# Patient Record
Sex: Female | Born: 1960 | Race: White | Hispanic: No | Marital: Married | Smoking: Never smoker
Health system: Southern US, Community
[De-identification: ages and names within clinical notes are randomized; demographics above are authoritative.]

## PROBLEM LIST (undated history)

## (undated) DIAGNOSIS — N390 Urinary tract infection, site not specified: Secondary | ICD-10-CM

## (undated) DIAGNOSIS — R519 Headache, unspecified: Secondary | ICD-10-CM

## (undated) DIAGNOSIS — Z9289 Personal history of other medical treatment: Secondary | ICD-10-CM

## (undated) DIAGNOSIS — J310 Chronic rhinitis: Secondary | ICD-10-CM

## (undated) DIAGNOSIS — F32A Depression, unspecified: Secondary | ICD-10-CM

## (undated) DIAGNOSIS — F329 Major depressive disorder, single episode, unspecified: Secondary | ICD-10-CM

## (undated) DIAGNOSIS — Z780 Asymptomatic menopausal state: Secondary | ICD-10-CM

## (undated) DIAGNOSIS — R51 Headache: Secondary | ICD-10-CM

## (undated) HISTORY — PX: BREAST SURGERY: SHX581

## (undated) HISTORY — PX: HERNIA REPAIR: SHX51

## (undated) HISTORY — DX: Urinary tract infection, site not specified: N39.0

## (undated) HISTORY — PX: TONSILLECTOMY: SUR1361

## (undated) HISTORY — DX: Personal history of other medical treatment: Z92.89

## (undated) HISTORY — PX: OTHER SURGICAL HISTORY: SHX169

---

## 2004-09-26 HISTORY — PX: AUGMENTATION MAMMAPLASTY: SUR837

## 2005-07-13 ENCOUNTER — Ambulatory Visit: Payer: Self-pay | Admitting: General Surgery

## 2005-09-01 ENCOUNTER — Ambulatory Visit: Payer: Self-pay

## 2006-09-04 ENCOUNTER — Ambulatory Visit: Payer: Self-pay

## 2008-02-12 ENCOUNTER — Ambulatory Visit: Payer: Self-pay | Admitting: Obstetrics and Gynecology

## 2008-11-12 ENCOUNTER — Ambulatory Visit: Payer: Self-pay | Admitting: Obstetrics and Gynecology

## 2008-11-17 ENCOUNTER — Ambulatory Visit: Payer: Self-pay | Admitting: Obstetrics and Gynecology

## 2009-06-17 ENCOUNTER — Ambulatory Visit: Payer: Self-pay | Admitting: Obstetrics and Gynecology

## 2009-09-26 HISTORY — PX: ABDOMINAL HYSTERECTOMY: SHX81

## 2010-01-27 ENCOUNTER — Ambulatory Visit: Payer: Self-pay | Admitting: Obstetrics and Gynecology

## 2010-07-05 ENCOUNTER — Ambulatory Visit: Payer: Self-pay | Admitting: Obstetrics and Gynecology

## 2010-07-19 ENCOUNTER — Ambulatory Visit: Payer: Self-pay | Admitting: Obstetrics and Gynecology

## 2011-08-08 ENCOUNTER — Ambulatory Visit: Payer: Self-pay | Admitting: Obstetrics and Gynecology

## 2012-08-08 ENCOUNTER — Ambulatory Visit: Payer: Self-pay | Admitting: Obstetrics and Gynecology

## 2013-08-12 ENCOUNTER — Ambulatory Visit: Payer: Self-pay | Admitting: Family Medicine

## 2013-08-16 ENCOUNTER — Ambulatory Visit: Payer: Self-pay | Admitting: Family Medicine

## 2014-10-03 ENCOUNTER — Ambulatory Visit: Payer: Self-pay | Admitting: Family Medicine

## 2015-06-25 ENCOUNTER — Other Ambulatory Visit: Payer: Self-pay | Admitting: Unknown Physician Specialty

## 2015-06-25 DIAGNOSIS — R51 Headache: Principal | ICD-10-CM

## 2015-06-25 DIAGNOSIS — R430 Anosmia: Secondary | ICD-10-CM

## 2015-06-25 DIAGNOSIS — R519 Headache, unspecified: Secondary | ICD-10-CM

## 2015-07-03 ENCOUNTER — Encounter: Payer: Self-pay | Admitting: *Deleted

## 2015-07-06 ENCOUNTER — Ambulatory Visit: Payer: BLUE CROSS/BLUE SHIELD | Admitting: Anesthesiology

## 2015-07-06 ENCOUNTER — Ambulatory Visit
Admission: RE | Admit: 2015-07-06 | Discharge: 2015-07-06 | Disposition: A | Discharge status: Home / Self Care | Payer: BLUE CROSS/BLUE SHIELD | Source: Ambulatory Visit | Attending: Gastroenterology | Admitting: Gastroenterology

## 2015-07-06 ENCOUNTER — Encounter
Admission: RE | Disposition: A | Discharge status: Home / Self Care | Payer: Self-pay | Source: Ambulatory Visit | Attending: Gastroenterology

## 2015-07-06 ENCOUNTER — Encounter: Payer: Self-pay | Admitting: *Deleted

## 2015-07-06 DIAGNOSIS — Z90710 Acquired absence of both cervix and uterus: Secondary | ICD-10-CM | POA: Insufficient documentation

## 2015-07-06 DIAGNOSIS — Z1211 Encounter for screening for malignant neoplasm of colon: Secondary | ICD-10-CM | POA: Insufficient documentation

## 2015-07-06 DIAGNOSIS — K5730 Diverticulosis of large intestine without perforation or abscess without bleeding: Secondary | ICD-10-CM | POA: Insufficient documentation

## 2015-07-06 DIAGNOSIS — F329 Major depressive disorder, single episode, unspecified: Secondary | ICD-10-CM | POA: Insufficient documentation

## 2015-07-06 DIAGNOSIS — K621 Rectal polyp: Secondary | ICD-10-CM | POA: Diagnosis not present

## 2015-07-06 HISTORY — DX: Headache, unspecified: R51.9

## 2015-07-06 HISTORY — PX: COLONOSCOPY WITH PROPOFOL: SHX5780

## 2015-07-06 HISTORY — DX: Chronic rhinitis: J31.0

## 2015-07-06 HISTORY — DX: Headache: R51

## 2015-07-06 HISTORY — DX: Major depressive disorder, single episode, unspecified: F32.9

## 2015-07-06 HISTORY — DX: Depression, unspecified: F32.A

## 2015-07-06 HISTORY — DX: Asymptomatic menopausal state: Z78.0

## 2015-07-06 SURGERY — COLONOSCOPY WITH PROPOFOL
Anesthesia: General

## 2015-07-06 MED ORDER — PROPOFOL 500 MG/50ML IV EMUL
INTRAVENOUS | Status: DC | PRN
  Administered 2015-07-06: 120 ug/kg/min via INTRAVENOUS

## 2015-07-06 MED ORDER — FENTANYL CITRATE (PF) 100 MCG/2ML IJ SOLN
INTRAMUSCULAR | Status: DC | PRN
  Administered 2015-07-06: 50 ug via INTRAVENOUS

## 2015-07-06 MED ORDER — SODIUM CHLORIDE 0.9 % IV SOLN
INTRAVENOUS | Status: DC
Start: 2015-07-06 — End: 2015-07-06
  Administered 2015-07-06: 1000 mL via INTRAVENOUS
  Administered 2015-07-06: 08:00:00 via INTRAVENOUS

## 2015-07-06 MED ORDER — PROPOFOL 10 MG/ML IV BOLUS
INTRAVENOUS | Status: DC | PRN
  Administered 2015-07-06: 10 mg via INTRAVENOUS
  Administered 2015-07-06: 20 mg via INTRAVENOUS

## 2015-07-06 MED ORDER — MIDAZOLAM HCL 2 MG/2ML IJ SOLN
INTRAMUSCULAR | Status: DC | PRN
  Administered 2015-07-06: 1 mg via INTRAVENOUS

## 2015-07-06 NOTE — Transfer of Care (Signed)
Immediate Anesthesia Transfer of Care Note  Patient: Christine Cardenas  Procedure(s) Performed: Procedure(s): COLONOSCOPY WITH PROPOFOL (N/A)  Patient Location: PACU  Anesthesia Type:General  Level of Consciousness: awake and alert   Airway & Oxygen Therapy: Patient Spontanous Breathing and Patient connected to nasal cannula oxygen  Post-op Assessment: Report given to RN and Post -op Vital signs reviewed and stable  Post vital signs: Reviewed and stable  Last Vitals:  Filed Vitals:   07/06/15 0722  BP: 139/97  Temp: 36.3 C  Resp: 16    Complications: No apparent anesthesia complications

## 2015-07-06 NOTE — Op Note (Signed)
[facility] Patient Name: Christine Cardenas Procedure Date: 07/06/2015 8:03 AM MRN: [id number] Account #: [id number] Date of Birth: 1961-06-11 Admit Type: Outpatient Age: 54 Room: [facility] ENDO ROOM 3 Gender: Female Note Status: Finalized Procedure:         Colonoscopy Indications:       Colon cancer screening in patient at increased risk:                     Family history of colon polyps, This is the patient's                     first colonoscopy Patient Profile:   This is a 54 year old female. Providers:         Rhona Raider. Shelle Iron, MD Referring MD:      Marisue Ivan (Referring MD) Medicines:         Propofol per Anesthesia Complications:     No immediate complications. Procedure:         Pre-Anesthesia Assessment:                    - Prior to the procedure, a History and Physical was                     performed, and patient medications, allergies and                     sensitivities were reviewed. The patient's tolerance of                     previous anesthesia was reviewed.                    After obtaining informed consent, the colonoscope was                     passed under direct vision. Throughout the procedure, the                     patient's blood pressure, pulse, and oxygen saturations                     were monitored continuously. The Colonoscope was                     introduced through the anus and advanced to the the cecum,                     identified by appendiceal orifice and ileocecal valve. The                     colonoscopy was somewhat difficult due to a tortuous                     colon. The patient tolerated the procedure well. The                     quality of the bowel preparation was excellent. Findings:      The perianal and digital rectal examinations were normal.      A 2 mm polyp was found in the rectum. The polyp was sessile. The polyp       was removed with a jumbo cold forceps. Resection and  retrieval were       complete.      A few  small and large-mouthed diverticula were found in the sigmoid       colon.      The exam was otherwise without abnormality. Impression:        - One 2 mm polyp in the rectum. Resected and retrieved.                    - Diverticulosis in the sigmoid colon.                    - The examination was otherwise normal. Recommendation:    - Observe patient in GI recovery unit.                    - High fiber diet.                    - Continue present medications.                    - Await pathology results.                    - Repeat colonoscopy for surveillance vs screening                     depending on path in 5 years.                    - Return to referring physician.                    - The findings and recommendations were discussed with the                     patient.                    - The findings and recommendations were discussed with the                     patient's family. Procedure Code(s): --- Professional ---                    [phone], Colonoscopy, flexible; with biopsy, single or                     multiple CPT copyright 2014 American Medical Association. All rights reserved. The codes documented in this report are preliminary and upon coder review may  be revised to meet current compliance requirements. Kathalene Frames, MD 07/06/2015 8:41:50 AM This report has been signed electronically. Number of Addenda: 0 Note Initiated On: 07/06/2015 8:03 AM Scope Withdrawal Time: 0 hours 13 minutes 13 seconds  Total Procedure Duration: 0 hours 20 minutes 8 seconds       [facility]

## 2015-07-06 NOTE — Anesthesia Postprocedure Evaluation (Signed)
  Anesthesia Post-op Note  Patient: Christine Cardenas  Procedure(s) Performed: Procedure(s): COLONOSCOPY WITH PROPOFOL (N/A)  Anesthesia type:General  Patient location: PACU  Post pain: Pain level controlled  Post assessment: Post-op Vital signs reviewed, Patient's Cardiovascular Status Stable, Respiratory Function Stable, Patent Airway and No signs of Nausea or vomiting  Post vital signs: Reviewed and stable  Last Vitals:  Filed Vitals:   07/06/15 0920  BP: 134/85  Pulse: 52  Temp:   Resp: 15    Level of consciousness: awake, alert  and patient cooperative  Complications: No apparent anesthesia complications

## 2015-07-06 NOTE — Anesthesia Preprocedure Evaluation (Signed)
Anesthesia Evaluation  Patient identified by MRN, date of birth, ID band Patient awake    Reviewed: Allergy & Precautions, H&P , NPO status , Patient's Chart, lab work & pertinent test results  History of Anesthesia Complications Negative for: history of anesthetic complications  Airway Mallampati: II  TM Distance: >3 FB Neck ROM: full    Dental no notable dental hx. (+) Teeth Intact   Pulmonary neg pulmonary ROS, neg shortness of breath,    Pulmonary exam normal breath sounds clear to auscultation       Cardiovascular Exercise Tolerance: Good (-) Past MI negative cardio ROS Normal cardiovascular exam Rhythm:regular Rate:Normal     Neuro/Psych  Headaches, PSYCHIATRIC DISORDERS Depression negative psych ROS   GI/Hepatic negative GI ROS, Neg liver ROS,   Endo/Other  negative endocrine ROS  Renal/GU negative Renal ROS  negative genitourinary   Musculoskeletal   Abdominal   Peds  Hematology negative hematology ROS (+)   Anesthesia Other Findings Past Medical History:   Headache                                                       Comment:migaines   Chronic rhinitis                                             Depression                                                     Comment:in remission   Menopause                                                   BMI    Body Mass Index   22.30 kg/m 2      Reproductive/Obstetrics negative OB ROS                             Anesthesia Physical Anesthesia Plan  ASA: III  Anesthesia Plan: General   Post-op Pain Management:    Induction:   Airway Management Planned:   Additional Equipment:   Intra-op Plan:   Post-operative Plan:   Informed Consent: I have reviewed the patients History and Physical, chart, labs and discussed the procedure including the risks, benefits and alternatives for the proposed anesthesia with the patient or  authorized representative who has indicated his/her understanding and acceptance.   Dental Advisory Given  Plan Discussed with: Anesthesiologist, CRNA and Surgeon  Anesthesia Plan Comments:         Anesthesia Quick Evaluation

## 2015-07-06 NOTE — H&P (Signed)
  Primary Care Physician:  Marisue Ivan, MD  Pre-Procedure History & Physical: HPI:  Christine Cardenas is a 54 y.o. female is here for an colonoscopy.   Past Medical History  Diagnosis Date  . Headache     migaines  . Chronic rhinitis   . Depression     in remission  . Menopause     Past Surgical History  Procedure Laterality Date  . Left foot bone removed    . Tonsillectomy    . Hernia repair      x2  . Abdominal hysterectomy    . Breast surgery      implants    Prior to Admission medications   Not on File    Allergies as of 05/26/2015  . (Not on File)    History reviewed. No pertinent family history.  Social History   Social History  . Marital Status: Married    Spouse Name: N/A  . Number of Children: N/A  . Years of Education: N/A   Occupational History  . Not on file.   Social History Main Topics  . Smoking status: Never Smoker   . Smokeless tobacco: Not on file  . Alcohol Use: Not on file  . Drug Use: Not on file  . Sexual Activity: Not on file   Other Topics Concern  . Not on file   Social History Narrative     Physical Exam: BP 139/97 mmHg  Temp(Src) 97.4 F (36.3 C) (Tympanic)  Resp 16  Ht 5' 1.5" (1.562 m)  Wt 54.432 kg (120 lb)  BMI 22.31 kg/m2  SpO2 100% General:   Alert,  pleasant and cooperative in NAD Head:  Normocephalic and atraumatic. Neck:  Supple; no masses or thyromegaly. Lungs:  Clear throughout to auscultation.    Heart:  Regular rate and rhythm. Abdomen:  Soft, nontender and nondistended. Normal bowel sounds, without guarding, and without rebound.   Neurologic:  Alert and  oriented x4;  grossly normal neurologically.  Impression/Plan: Christine Cardenas is here for an colonoscopy to be performed for screening  Risks, benefits, limitations, and alternatives regarding  colonoscopy have been reviewed with the patient.  Questions have been answered.  All parties agreeable.   Elnita Maxwell, MD  07/06/2015, 8:09  AM

## 2015-07-07 ENCOUNTER — Ambulatory Visit
Admission: RE | Admit: 2015-07-07 | Discharge: 2015-07-07 | Disposition: A | Discharge status: Home / Self Care | Payer: BLUE CROSS/BLUE SHIELD | Source: Ambulatory Visit | Attending: Unknown Physician Specialty | Admitting: Unknown Physician Specialty

## 2015-07-07 DIAGNOSIS — R519 Headache, unspecified: Secondary | ICD-10-CM

## 2015-07-07 DIAGNOSIS — R430 Anosmia: Secondary | ICD-10-CM | POA: Diagnosis present

## 2015-07-07 DIAGNOSIS — R51 Headache: Secondary | ICD-10-CM | POA: Diagnosis present

## 2015-07-07 LAB — SURGICAL PATHOLOGY

## 2015-07-15 ENCOUNTER — Encounter: Payer: Self-pay | Admitting: Gastroenterology

## 2015-07-28 DIAGNOSIS — Z9289 Personal history of other medical treatment: Secondary | ICD-10-CM

## 2015-07-28 HISTORY — DX: Personal history of other medical treatment: Z92.89

## 2015-08-04 ENCOUNTER — Other Ambulatory Visit: Payer: Self-pay | Admitting: Family Medicine

## 2015-08-04 DIAGNOSIS — Z1231 Encounter for screening mammogram for malignant neoplasm of breast: Secondary | ICD-10-CM

## 2015-08-11 LAB — HM MAMMOGRAPHY

## 2015-09-30 ENCOUNTER — Ambulatory Visit
Admission: RE | Admit: 2015-09-30 | Discharge: 2015-09-30 | Disposition: A | Discharge status: Home / Self Care | Payer: BLUE CROSS/BLUE SHIELD | Source: Ambulatory Visit | Attending: Family Medicine | Admitting: Family Medicine

## 2015-09-30 DIAGNOSIS — Z1231 Encounter for screening mammogram for malignant neoplasm of breast: Secondary | ICD-10-CM | POA: Diagnosis present

## 2015-10-05 ENCOUNTER — Ambulatory Visit: Payer: BLUE CROSS/BLUE SHIELD

## 2015-12-23 LAB — HM PAP SMEAR

## 2016-07-29 ENCOUNTER — Other Ambulatory Visit: Payer: Self-pay | Admitting: Family Medicine

## 2016-07-29 DIAGNOSIS — Z1231 Encounter for screening mammogram for malignant neoplasm of breast: Secondary | ICD-10-CM

## 2016-10-03 ENCOUNTER — Ambulatory Visit: Payer: BLUE CROSS/BLUE SHIELD

## 2016-10-24 ENCOUNTER — Ambulatory Visit
Admission: RE | Admit: 2016-10-24 | Discharge: 2016-10-24 | Disposition: A | Discharge status: Home / Self Care | Payer: BLUE CROSS/BLUE SHIELD | Source: Ambulatory Visit | Attending: Family Medicine | Admitting: Family Medicine

## 2016-10-24 DIAGNOSIS — Z1231 Encounter for screening mammogram for malignant neoplasm of breast: Secondary | ICD-10-CM | POA: Insufficient documentation

## 2017-03-07 ENCOUNTER — Ambulatory Visit: Payer: Self-pay | Admitting: Obstetrics and Gynecology

## 2017-04-19 ENCOUNTER — Ambulatory Visit (INDEPENDENT_AMBULATORY_CARE_PROVIDER_SITE_OTHER): Payer: BLUE CROSS/BLUE SHIELD | Admitting: Obstetrics and Gynecology

## 2017-04-19 ENCOUNTER — Ambulatory Visit: Payer: Self-pay | Admitting: Obstetrics and Gynecology

## 2017-04-19 ENCOUNTER — Encounter: Payer: Self-pay | Admitting: Obstetrics and Gynecology

## 2017-04-19 VITALS — BP 120/80 | HR 67 | Ht 62.0 in | Wt 126.0 lb

## 2017-04-19 DIAGNOSIS — R300 Dysuria: Secondary | ICD-10-CM | POA: Diagnosis not present

## 2017-04-19 DIAGNOSIS — R519 Headache, unspecified: Secondary | ICD-10-CM | POA: Insufficient documentation

## 2017-04-19 DIAGNOSIS — F329 Major depressive disorder, single episode, unspecified: Secondary | ICD-10-CM | POA: Insufficient documentation

## 2017-04-19 DIAGNOSIS — R51 Headache: Secondary | ICD-10-CM

## 2017-04-19 DIAGNOSIS — F32A Depression, unspecified: Secondary | ICD-10-CM | POA: Insufficient documentation

## 2017-04-19 LAB — POCT URINALYSIS DIPSTICK
BILIRUBIN UA: NEGATIVE
Blood, UA: NEGATIVE
Glucose, UA: NEGATIVE
KETONES UA: NEGATIVE
LEUKOCYTES UA: NEGATIVE
Nitrite, UA: NEGATIVE
Urobilinogen, UA: NEGATIVE E.U./dL — AB
pH, UA: 5 (ref 5.0–8.0)

## 2017-04-19 NOTE — Progress Notes (Signed)
   Chief Complaint  Patient presents with  . Urinary Tract Infection    HPI:      Ms. Christine Cardenas is a 89 y.[place]. [phone] who LMP was No LMP recorded. Patient has had a hysterectomy., presents today for UTI sx of dysuria/twinge this morning. She denies urin frequency/urgency/hematuria, belly pain, fevers, vag sx. She has a little LT LBP. She has a hx of UTIs in the past. She is drinking lots of water today.    Patient Active Problem List   Diagnosis Date Noted  . Headache   . Depression     Family History  Problem Relation Age of Onset  . Breast cancer Neg Hx   . Hypertension Neg Hx   . Hyperlipidemia Neg Hx   . Diabetes Neg Hx   . Cancer Neg Hx     Social History   Social History  . Marital status: Legally Separated    Spouse name: N/A  . Number of children: N/A  . Years of education: N/A   Occupational History  . Not on file.   Social History Main Topics  . Smoking status: Never Smoker  . Smokeless tobacco: Never Used  . Alcohol use No  . Drug use: No  . Sexual activity: Yes    Birth control/ protection: Post-menopausal   Other Topics Concern  . Not on file   Social History Narrative  . No narrative on file    No current outpatient prescriptions on file.  Review of Systems  Constitutional: Negative for fever.  Gastrointestinal: Negative for blood in stool, constipation, diarrhea, nausea and vomiting.  Genitourinary: Positive for dysuria. Negative for dyspareunia, flank pain, frequency, hematuria, urgency, vaginal bleeding, vaginal discharge and vaginal pain.  Musculoskeletal: Positive for back pain.  Skin: Negative for rash.     OBJECTIVE:   Vitals:  BP 120/80   Pulse 67   Ht 5\' 2"  (1.575 m)   Wt 126 lb (57.2 kg)   BMI 23.05 kg/m   Physical Exam  Constitutional: She is oriented to person, place, and time and well-developed, well-nourished, and in no distress.  Abdominal: There is no CVA tenderness.  Neurological: She is alert and oriented to  person, place, and time.  Psychiatric: Affect and judgment normal.  Vitals reviewed.   Results: Results for orders placed or performed in visit on 04/19/17 (from the past 24 hour(s))  POCT Urinalysis Dipstick     Status: Abnormal   Collection Time: 04/19/17  2:24 PM  Result Value Ref Range   Color, UA yellow    Clarity, UA clear    Glucose, UA neg    Bilirubin, UA neg    Ketones, UA neg    Spec Grav, UA >=1.030 (A) 1.010 - 1.025   Blood, UA neg    pH, UA 5.0 5.0 - 8.0   Protein, UA trace    Urobilinogen, UA negative (A) 0.2 or 1.0 E.U./dL   Nitrite, UA neg    Leukocytes, UA Negative Negative     Assessment/Plan: Dysuria - Neg exam/sx now. Check C&S. Will f/u with resutls. Pt to f/u sooner if sx recur/worsen.  - Plan: POCT Urinalysis Dipstick, Urine Culture     Return if symptoms worsen or fail to improve.  Alicia B. Copland, PA-C 04/19/2017 2:33 PM

## 2017-04-21 LAB — URINE CULTURE

## 2017-05-23 ENCOUNTER — Ambulatory Visit (INDEPENDENT_AMBULATORY_CARE_PROVIDER_SITE_OTHER): Payer: BLUE CROSS/BLUE SHIELD | Admitting: Obstetrics and Gynecology

## 2017-05-23 ENCOUNTER — Encounter: Payer: Self-pay | Admitting: Obstetrics and Gynecology

## 2017-05-23 VITALS — BP 114/74 | Ht 62.0 in | Wt 131.0 lb

## 2017-05-23 DIAGNOSIS — N951 Menopausal and female climacteric states: Secondary | ICD-10-CM | POA: Diagnosis not present

## 2017-05-23 DIAGNOSIS — Z01419 Encounter for gynecological examination (general) (routine) without abnormal findings: Secondary | ICD-10-CM | POA: Diagnosis not present

## 2017-05-23 DIAGNOSIS — Z1239 Encounter for other screening for malignant neoplasm of breast: Secondary | ICD-10-CM

## 2017-05-23 DIAGNOSIS — Z1231 Encounter for screening mammogram for malignant neoplasm of breast: Secondary | ICD-10-CM

## 2017-05-23 MED ORDER — ESTRADIOL 0.1 MG/GM VA CREA: TOPICAL_CREAM | VAGINAL | 1 refills | Status: DC

## 2017-05-23 NOTE — Progress Notes (Signed)
PCP: Marisue Ivan, MD   Chief Complaint  Patient presents with  . Annual Exam    HPI:      Ms. Christine Cardenas is a 56 y.o. [phone] who LMP was No LMP recorded. Patient has had a hysterectomy., presents today for her annual examination.  Her menses are absent due to TAH for endometriosis. She does not have intermenstrual bleeding.  She does have vasomotor sx that are tolerable.  Sex activity: single partner, contraception - status post hysterectomy and post menopausal status. She does have vaginal dryness. She is trying lubricants with some relief.   Last Pap: December 23, 2015  Results were: no abnormalities /neg HPV DNA.  Hx of STDs: none  Last mammogram: October 24, 2016  Results were: normal--routine follow-up in 12 months There is no FH of breast cancer. There is no FH of ovarian cancer. The patient does do self-breast exams.  Colonoscopy: colonoscopy 2 years ago with abnormalities. . Repeat due after 5 years. She has ext hemorrhoids with discomfort/bleeding occas. She tries to keep her stools soft and regular.   Tobacco use: The patient denies current or previous tobacco use. Alcohol use: none Exercise: moderately active  She does get adequate calcium and Vitamin D in her diet.  Since her last annual GYN exam, she has not had any significant changes in her health history.   Labs with PCP.  She has had issues with increased anxiety over the past couple yrs since her divorce. She has seen a therapist for years. She is exercising. She also has trouble sleeping.   Past Medical History:  Diagnosis Date  . Chronic rhinitis   . Depression    in remission  . Headache    migaines  . History of mammogram 07/2015   WNL  . History of Papanicolaou smear of cervix >5YRS; 12/23/15   NO HX OF ABNL; -/-  . Menopause   . UTI (urinary tract infection)     Past Surgical History:  Procedure Laterality Date  . ABDOMINAL HYSTERECTOMY  2011   DR. EVANS, ENDOMATRIOSIS  .  AUGMENTATION MAMMAPLASTY Bilateral 2006  . BREAST SURGERY     implants  . COLONOSCOPY WITH PROPOFOL N/A 07/06/2015   Procedure: COLONOSCOPY WITH PROPOFOL;  Surgeon: Elnita Maxwell, MD;  Location: [facility] ENDOSCOPY;  Service: Endoscopy;  Laterality: N/A;  . HERNIA REPAIR     x2  . left foot bone removed    . TONSILLECTOMY      Family History  Problem Relation Age of Onset  . Breast cancer Neg Hx   . Hypertension Neg Hx   . Hyperlipidemia Neg Hx   . Diabetes Neg Hx   . Cancer Neg Hx     Social History   Social History  . Marital status: Legally Separated    Spouse name: N/A  . Number of children: N/A  . Years of education: N/A   Occupational History  . Not on file.   Social History Main Topics  . Smoking status: Never Smoker  . Smokeless tobacco: Never Used  . Alcohol use No  . Drug use: No  . Sexual activity: Yes    Birth control/ protection: Post-menopausal   Other Topics Concern  . Not on file   Social History Narrative  . No narrative on file    No outpatient prescriptions have been marked as taking for the 05/23/17 encounter (Office Visit) with Copland, Ilona Sorrel, PA-C.      ROS:  Review of  Systems  Constitutional: Negative for fatigue, fever and unexpected weight change.  Respiratory: Negative for cough, shortness of breath and wheezing.   Cardiovascular: Negative for chest pain, palpitations and leg swelling.  Gastrointestinal: Positive for anal bleeding and rectal pain. Negative for blood in stool, constipation, diarrhea, nausea and vomiting.  Endocrine: Negative for cold intolerance, heat intolerance and polyuria.  Genitourinary: Positive for dyspareunia. Negative for dysuria, flank pain, frequency, genital sores, hematuria, menstrual problem, pelvic pain, urgency, vaginal bleeding, vaginal discharge and vaginal pain.  Musculoskeletal: Negative for back pain, joint swelling and myalgias.  Skin: Negative for rash.  Neurological: Negative for  dizziness, syncope, light-headedness, numbness and headaches.  Hematological: Negative for adenopathy.  Psychiatric/Behavioral: Negative for agitation, confusion, sleep disturbance and suicidal ideas. The patient is not nervous/anxious.      Objective: BP 114/74   Ht 5\' 2"  (1.575 m)   Wt 131 lb (59.4 kg)   BMI 23.96 kg/m    Physical Exam  Constitutional: She is oriented to person, place, and time. She appears well-developed and well-nourished.  Genitourinary: Vagina normal. There is no rash or tenderness on the right labia. There is no rash or tenderness on the left labia. No erythema or tenderness in the vagina. No vaginal discharge found. Right adnexum does not display mass and does not display tenderness. Left adnexum does not display mass and does not display tenderness. Rectal exam shows external hemorrhoid. Rectal exam shows no fissure and no tenderness.  Genitourinary Comments: UTERUS/CX SURG REM VAGINAL ATROPHY AT INTROITUS PARTICULARLY  Neck: Normal range of motion. No thyromegaly present.  Cardiovascular: Normal rate, regular rhythm and normal heart sounds.   No murmur heard. Pulmonary/Chest: Effort normal and breath sounds normal. Right breast exhibits no mass, no nipple discharge, no skin change and no tenderness. Left breast exhibits no mass, no nipple discharge, no skin change and no tenderness.  Abdominal: Soft. There is no tenderness. There is no guarding.  Musculoskeletal: Normal range of motion.  Neurological: She is alert and oriented to person, place, and time. No cranial nerve deficit.  Psychiatric: She has a normal mood and affect. Her behavior is normal.  Vitals reviewed.   Assessment/Plan:  Encounter for annual routine gynecological examination  Screening for breast cancer - Pt current on mammo.  Vaginal dryness, menopausal - Rx estrace crm/coupon card. Can use coconut oil as lubricant. F/u prn.  - Plan: estradiol (ESTRACE) 0.1 MG/GM vaginal cream   Meds  ordered this encounter  Medications  . estradiol (ESTRACE) 0.1 MG/GM vaginal cream    Sig: Insert 1g nightly for 1 wk, then 1 g once weekly as maintenace    Dispense:  42.5 g    Refill:  1            GYN counsel mammography screening, menopause, adequate intake of calcium and vitamin D, diet and exercise    F/U  Return in about 1 year (around 05/23/2018).  Alicia B. Copland, PA-C 05/23/2017 5:11 PM

## 2017-06-16 DIAGNOSIS — R5383 Other fatigue: Secondary | ICD-10-CM | POA: Diagnosis not present

## 2017-06-16 DIAGNOSIS — G44209 Tension-type headache, unspecified, not intractable: Secondary | ICD-10-CM | POA: Diagnosis not present

## 2017-09-11 DIAGNOSIS — Z0000 Encounter for general adult medical examination without abnormal findings: Secondary | ICD-10-CM | POA: Diagnosis not present

## 2017-09-11 DIAGNOSIS — Z7185 Encounter for immunization safety counseling: Secondary | ICD-10-CM | POA: Insufficient documentation

## 2017-09-13 ENCOUNTER — Other Ambulatory Visit: Payer: Self-pay | Admitting: Family Medicine

## 2017-09-13 DIAGNOSIS — Z0000 Encounter for general adult medical examination without abnormal findings: Secondary | ICD-10-CM | POA: Diagnosis not present

## 2017-09-13 DIAGNOSIS — Z1231 Encounter for screening mammogram for malignant neoplasm of breast: Secondary | ICD-10-CM

## 2017-10-06 ENCOUNTER — Ambulatory Visit
Admission: RE | Admit: 2017-10-06 | Discharge: 2017-10-06 | Disposition: A | Discharge status: Home / Self Care | Payer: BLUE CROSS/BLUE SHIELD | Source: Ambulatory Visit | Attending: Family Medicine | Admitting: Family Medicine

## 2017-10-06 DIAGNOSIS — Z1231 Encounter for screening mammogram for malignant neoplasm of breast: Secondary | ICD-10-CM | POA: Diagnosis not present

## 2017-11-24 DIAGNOSIS — R1032 Left lower quadrant pain: Secondary | ICD-10-CM | POA: Diagnosis not present

## 2018-06-15 ENCOUNTER — Ambulatory Visit: Payer: Self-pay | Admitting: Obstetrics and Gynecology

## 2018-06-15 ENCOUNTER — Encounter: Payer: Self-pay | Admitting: Obstetrics and Gynecology

## 2018-06-15 VITALS — BP 108/68 | HR 72 | Ht 62.0 in | Wt 124.5 lb

## 2018-06-15 DIAGNOSIS — B9689 Other specified bacterial agents as the cause of diseases classified elsewhere: Secondary | ICD-10-CM

## 2018-06-15 DIAGNOSIS — N760 Acute vaginitis: Secondary | ICD-10-CM

## 2018-06-15 MED ORDER — METRONIDAZOLE 500 MG PO TABS: 500.0000 mg | ORAL_TABLET | Freq: Two times a day (BID) | ORAL | 0 refills | Status: AC

## 2018-06-15 NOTE — Progress Notes (Signed)
Patient ID: Christine Cardenas, female   DOB: 25-Mar-1961, 57 y.o.   MRN: [id number]  Reason for Consult: Vaginitis (Vaginal burning, itching, grey discharge, fishy odor )   Referred by Marisue Ivan, MD  Subjective:     HPI:  Christine Cardenas is a 57 y.o. female . She is here today with complaints of vaginal burning, itching, and a thing grey discharge that has been present since labor day. Today she douched with hydrogen peroxide and water which cased a lot of pain and burning. She only has one sexual partner.   Past Medical History:  Diagnosis Date  . Chronic rhinitis   . Depression    in remission  . Headache    migaines  . History of mammogram 07/2015   WNL  . History of Papanicolaou smear of cervix >56YRS; 12/23/15   NO HX OF ABNL; -/-  . Menopause   . UTI (urinary tract infection)    Family History  Problem Relation Age of Onset  . Breast cancer Neg Hx   . Hypertension Neg Hx   . Hyperlipidemia Neg Hx   . Diabetes Neg Hx   . Cancer Neg Hx    Past Surgical History:  Procedure Laterality Date  . ABDOMINAL HYSTERECTOMY  2011   DR. EVANS, ENDOMATRIOSIS  . AUGMENTATION MAMMAPLASTY Bilateral 2006  . BREAST SURGERY     implants  . COLONOSCOPY WITH PROPOFOL N/A 07/06/2015   Procedure: COLONOSCOPY WITH PROPOFOL;  Surgeon: Elnita Maxwell, MD;  Location: [facility] ENDOSCOPY;  Service: Endoscopy;  Laterality: N/A;  . HERNIA REPAIR     x2  . left foot bone removed    . TONSILLECTOMY      Short Social History:  Social History   Tobacco Use  . Smoking status: Never Smoker  . Smokeless tobacco: Never Used  Substance Use Topics  . Alcohol use: No    Allergies  Allergen Reactions  . Effexor [Venlafaxine] Other (See Comments)    Vomiting, nausea; could not sleep; felt things on scalp; weakness  . Paroxetine Hcl Other (See Comments)    Felt "terrible", weakness, felt depressed  . Penicillins Other (See Comments)    Current Outpatient Medications  Medication Sig  Dispense Refill  . Calcium Carbonate-Vitamin D 600-400 MG-UNIT tablet Take by mouth.    . fluticasone (FLONASE) 50 MCG/ACT nasal spray Place into the nose.    . Omega-3 1000 MG CAPS Take by mouth.    . estradiol (ESTRACE) 0.1 MG/GM vaginal cream Insert 1g nightly for 1 wk, then 1 g once weekly as maintenace (Patient not taking: Reported on 06/15/2018) 42.5 g 1  . metroNIDAZOLE (FLAGYL) 500 MG tablet Take 1 tablet (500 mg total) by mouth 2 (two) times daily for 7 days. 14 tablet 0   No current facility-administered medications for this visit.     Review of Systems  Constitutional: Negative for chills, fatigue, fever and unexpected weight change.  HENT: Negative for trouble swallowing.  Eyes: Negative for loss of vision.  Respiratory: Negative for cough, shortness of breath and wheezing.  Cardiovascular: Negative for chest pain, leg swelling, palpitations and syncope.  GI: Negative for abdominal pain, blood in stool, diarrhea, nausea and vomiting.  GU: Negative for difficulty urinating, dysuria, frequency and hematuria.  Musculoskeletal: Negative for back pain, leg pain and joint pain.  Skin: Negative for rash.  Neurological: Negative for dizziness, headaches, light-headedness, numbness and seizures.  Psychiatric: Negative for behavioral problem, confusion, depressed mood and sleep disturbance.  Objective:  Objective   Vitals:   06/15/18 1110  BP: 108/68  Pulse: 72  Weight: 124 lb 8 oz (56.5 kg)  Height: 5\' 2"  (1.575 m)   Body mass index is 22.77 kg/m.  Physical Exam  Constitutional: She is oriented to person, place, and time. She appears well-developed and well-nourished.  HENT:  Head: Normocephalic and atraumatic.  Eyes: Pupils are equal, round, and reactive to light. EOM are normal.  Cardiovascular: Normal rate and regular rhythm.  Pulmonary/Chest: Effort normal. No respiratory distress.  Genitourinary: Vaginal discharge found.  Genitourinary Comments: Thin yellow  discharge  Neurological: She is alert and oriented to person, place, and time.  Skin: Skin is warm and dry.  Psychiatric: She has a normal mood and affect. Her behavior is normal. Judgment and thought content normal.  Nursing note and vitals reviewed.   Wet Prep: Clue Cells: Positive Fungal elements: Negative Trichomonas: Negative      Assessment/Plan:     57 yo with bacterial vaginosis Will treat with flagyl. She is taking a probiotic. Discussed dietary habits to promote healthy vaginal flora.    More than 15 minutes were spent face to face with the patient in the room with more than 50% of the time spent providing counseling and discussing the plan of management.    Adelene Idlerhristanna Schuman MD [facility], [facility] 06/15/18 11:35 AM

## 2018-06-20 ENCOUNTER — Telehealth (INDEPENDENT_AMBULATORY_CARE_PROVIDER_SITE_OTHER): Payer: BLUE CROSS/BLUE SHIELD

## 2018-06-20 DIAGNOSIS — N3001 Acute cystitis with hematuria: Secondary | ICD-10-CM | POA: Diagnosis not present

## 2018-06-20 NOTE — Telephone Encounter (Signed)
Spoke with pt. She can come by office in AM for urine dip. Having urine odor but also on flagyl for BV, as well as mild dysuria. Hx of UTI in past.

## 2018-06-20 NOTE — Telephone Encounter (Signed)
Pt states she was here Friday & treated for BV. She thinks she has a UTI now. Pt inquiring if she can drop off a urine sample in the morning. She is leaving to go out of town of Friday. She doesn't have insurance right now & looking for her best options for treatment. Cb#[phone]. ABC pt. Saw CS on Friday.

## 2018-06-21 ENCOUNTER — Ambulatory Visit: Payer: Self-pay

## 2018-06-21 ENCOUNTER — Other Ambulatory Visit (HOSPITAL_COMMUNITY)
Admission: RE | Admit: 2018-06-21 | Discharge: 2018-06-21 | Disposition: A | Discharge status: Home / Self Care | Payer: BLUE CROSS/BLUE SHIELD | Source: Ambulatory Visit | Attending: Obstetrics and Gynecology | Admitting: Obstetrics and Gynecology

## 2018-06-21 DIAGNOSIS — R300 Dysuria: Secondary | ICD-10-CM | POA: Insufficient documentation

## 2018-06-21 LAB — POCT URINALYSIS DIPSTICK
GLUCOSE UA: NEGATIVE
RBC UA: POSITIVE

## 2018-06-21 MED ORDER — NITROFURANTOIN MONOHYD MACRO 100 MG PO CAPS: 100.0000 mg | ORAL_CAPSULE | Freq: Two times a day (BID) | ORAL | 0 refills | Status: AC

## 2018-06-21 NOTE — Telephone Encounter (Signed)
Pt aware UA suggestive of UTI. Rx macrobid. Sent for C&S. F/u prn.

## 2018-06-23 LAB — URINE CULTURE

## 2019-01-01 DIAGNOSIS — R350 Frequency of micturition: Secondary | ICD-10-CM | POA: Diagnosis not present

## 2019-01-01 DIAGNOSIS — R300 Dysuria: Secondary | ICD-10-CM | POA: Diagnosis not present

## 2019-01-01 DIAGNOSIS — N390 Urinary tract infection, site not specified: Secondary | ICD-10-CM | POA: Diagnosis not present

## 2019-02-07 DIAGNOSIS — Z03818 Encounter for observation for suspected exposure to other biological agents ruled out: Secondary | ICD-10-CM | POA: Diagnosis not present

## 2019-02-07 DIAGNOSIS — E7800 Pure hypercholesterolemia, unspecified: Secondary | ICD-10-CM | POA: Diagnosis not present

## 2019-02-07 DIAGNOSIS — Z0000 Encounter for general adult medical examination without abnormal findings: Secondary | ICD-10-CM | POA: Diagnosis not present

## 2019-02-12 ENCOUNTER — Other Ambulatory Visit: Payer: Self-pay | Admitting: Family Medicine

## 2019-02-12 DIAGNOSIS — Z1231 Encounter for screening mammogram for malignant neoplasm of breast: Secondary | ICD-10-CM

## 2019-02-15 DIAGNOSIS — Z0000 Encounter for general adult medical examination without abnormal findings: Secondary | ICD-10-CM | POA: Diagnosis not present

## 2019-03-04 ENCOUNTER — Other Ambulatory Visit: Payer: Self-pay

## 2019-03-04 ENCOUNTER — Ambulatory Visit
Admission: RE | Admit: 2019-03-04 | Discharge: 2019-03-04 | Disposition: A | Discharge status: Home / Self Care | Payer: BC Managed Care – PPO | Source: Ambulatory Visit | Attending: Family Medicine | Admitting: Family Medicine

## 2019-03-04 DIAGNOSIS — H60332 Swimmer's ear, left ear: Secondary | ICD-10-CM | POA: Diagnosis not present

## 2019-03-04 DIAGNOSIS — Z1231 Encounter for screening mammogram for malignant neoplasm of breast: Secondary | ICD-10-CM | POA: Insufficient documentation

## 2019-10-28 HISTORY — PX: AUGMENTATION MAMMAPLASTY: SUR837

## 2020-04-29 ENCOUNTER — Other Ambulatory Visit: Payer: Self-pay | Admitting: Family Medicine

## 2020-04-29 DIAGNOSIS — Z1231 Encounter for screening mammogram for malignant neoplasm of breast: Secondary | ICD-10-CM

## 2020-06-03 ENCOUNTER — Ambulatory Visit
Admission: RE | Admit: 2020-06-03 | Discharge: 2020-06-03 | Disposition: A | Discharge status: Home / Self Care | Payer: BC Managed Care – PPO | Source: Ambulatory Visit | Attending: Family Medicine | Admitting: Family Medicine

## 2020-06-03 DIAGNOSIS — Z1231 Encounter for screening mammogram for malignant neoplasm of breast: Secondary | ICD-10-CM

## 2020-09-16 ENCOUNTER — Ambulatory Visit: Payer: BC Managed Care – PPO | Admitting: Obstetrics and Gynecology

## 2020-10-30 ENCOUNTER — Ambulatory Visit (INDEPENDENT_AMBULATORY_CARE_PROVIDER_SITE_OTHER): Payer: BC Managed Care – PPO | Admitting: Obstetrics and Gynecology

## 2020-10-30 ENCOUNTER — Other Ambulatory Visit: Payer: Self-pay

## 2020-10-30 ENCOUNTER — Other Ambulatory Visit (HOSPITAL_COMMUNITY)
Admission: RE | Admit: 2020-10-30 | Discharge: 2020-10-30 | Disposition: A | Discharge status: Home / Self Care | Payer: BC Managed Care – PPO | Source: Ambulatory Visit | Attending: Obstetrics and Gynecology | Admitting: Obstetrics and Gynecology

## 2020-10-30 VITALS — BP 114/72 | Ht 61.0 in | Wt 127.2 lb

## 2020-10-30 DIAGNOSIS — Z1211 Encounter for screening for malignant neoplasm of colon: Secondary | ICD-10-CM | POA: Diagnosis not present

## 2020-10-30 DIAGNOSIS — Z01419 Encounter for gynecological examination (general) (routine) without abnormal findings: Secondary | ICD-10-CM

## 2020-10-30 DIAGNOSIS — Z13820 Encounter for screening for osteoporosis: Secondary | ICD-10-CM

## 2020-10-30 DIAGNOSIS — Z0000 Encounter for general adult medical examination without abnormal findings: Secondary | ICD-10-CM

## 2020-10-30 DIAGNOSIS — Z1231 Encounter for screening mammogram for malignant neoplasm of breast: Secondary | ICD-10-CM

## 2020-10-30 DIAGNOSIS — Z124 Encounter for screening for malignant neoplasm of cervix: Secondary | ICD-10-CM | POA: Insufficient documentation
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
## 2020-10-30 NOTE — Patient Instructions (Addendum)
Institute of Medicine Recommended Dietary Allowances for Calcium and Vitamin D  Age (yr) Calcium Recommended Dietary Allowance (mg/day) Vitamin D Recommended Dietary Allowance (international units/day)  9-18 1,300 600  19-50 1,000 600  51-70 1,200 600  71 and older 1,200 800  Data from Institute of Medicine. Dietary reference intakes: calcium, vitamin D. [place], [place]: [employer]; 2011.    Budget-Friendly Healthy Eating There are many ways to save money at the grocery store and continue to eat healthy. You can be successful if you:  Plan meals according to your budget.  Make a grocery list and only purchase food according to your grocery list.  Prepare food yourself at home. What are tips for following this plan? Reading food labels  Compare food labels between brand name foods and the store brand. Often the nutritional value is the same, but the store brand is lower cost.  Look for products that do not have added sugar, fat, or salt (sodium). These often cost the same but are healthier for you. Products may be labeled as: ? Sugar-free. ? Nonfat. ? Low-fat. ? Sodium-free. ? Low-sodium.  Look for lean ground beef labeled as at least 92% lean and 8% fat. Shopping  Buy only the items on your grocery list and go only to the areas of the store that have the items on your list.  Use coupons only for foods and brands you normally buy. Avoid buying items you wouldn't normally buy simply because they are on sale.  Check online and in newspapers for weekly deals.  Buy healthy items from the bulk bins when available, such as herbs, spices, flour, pasta, nuts, and dried fruit.  Buy fruits and vegetables that are in season. Prices are usually lower on in-season produce.  Look at the unit price on the price tag. Use it to compare different brands and sizes to find out which item is the best deal.  Choose healthy items that are often low-cost, such as carrots,  potatoes, apples, bananas, and oranges. Dried or canned beans are a low-cost protein source.  Buy in bulk and freeze extra food. Items you can buy in bulk include meats, fish, poultry, frozen fruits, and frozen vegetables.  Avoid buying "ready-to-eat" foods, such as pre-cut fruits and vegetables and pre-made salads.  If possible, shop around to discover where you can find the best prices. Consider other retailers such as dollar stores, larger [employer], local fruit and vegetable stands, and farmers markets.  Do not shop when you are hungry. If you shop while hungry, it may be hard to stick to your list and budget.  Resist impulse buying. Use your grocery list as your official plan for the week.  Buy a variety of vegetables and fruits by purchasing fresh, frozen, and canned items.  Look at the top and bottom shelves for deals. Foods at eye level (eye level of an adult or child) are usually more expensive.  Be efficient with your time when shopping. The more time you spend at the store, the more money you are likely to spend.  To save money when choosing more expensive foods like meats and dairy: ? Choose cheaper cuts of meat, such as bone-in chicken thighs and drumsticks instead of skinless and boneless chicken. When you are ready to prepare the chicken, you can remove the skin yourself to make it healthier. ? Choose lean meats like chicken or [place] instead of beef. ? Choose canned seafood, such as tuna, salmon, or sardines. ? Buy  eggs as a low-cost source of protein. ? Buy dried beans and peas, such as lentils, split peas, or kidney beans instead of meats. Dried beans and peas are a good alternative source of protein. ? Buy the larger tubs of yogurt instead of individual-sized containers.  Choose water instead of sodas and other sweetened beverages.  Avoid buying chips, cookies, and other "junk food." These items are usually expensive and not healthy.   Cooking  Make extra food  and freeze the extras in meal-sized containers or in individual portions for fast meals and snacks.  Pre-cook on days when you have extra time to prepare meals in advance. You can keep these meals in the fridge or freezer and reheat for a quick meal.  When you come home from the grocery store, wash, peel, and cut fruits and vegetables so they are ready to use and eat. This will help reduce food waste. Meal planning  Do not eat out or get fast food. Prepare food at home.  Make a grocery list and make sure to bring it with you to the store. If you have a smart phone, you could use your phone to create your shopping list.  Plan meals and snacks according to a grocery list and budget you create.  Use leftovers in your meal plan for the week.  Look for recipes where you can cook once and make enough food for two meals.  Prepare budget-friendly types of meals like stews, casseroles, and stir-fry dishes.  Try some meatless meals or try "no cook" meals like salads.  Make sure that half your plate is filled with fruits or vegetables. Choose from fresh, frozen, or canned fruits and vegetables. If eating canned, remember to rinse them before eating. This will remove any excess salt added for packaging. Summary  Eating healthy on a budget is possible if you plan your meals according to your budget, purchase according to your budget and grocery list, and prepare food yourself.  Tips for buying more food on a limited budget include buying generic brands, using coupons only for foods you normally buy, and buying healthy items from the bulk bins when available.  Tips for buying cheaper food to replace expensive food include choosing cheaper, lean cuts of meat, and buying dried beans and peas. This information is not intended to replace advice given to you by your health care provider. Make sure you discuss any questions you have with your health care provider. Document Revised: 06/25/2020 Document  Reviewed: 06/25/2020 Elsevier Patient Education  2021 Elsevier Inc.   Bone Health Bones protect organs, store calcium, anchor muscles, and support the whole body. Keeping your bones strong is important, especially as you get older. You can take actions to help keep your bones strong and healthy. Why is keeping my bones healthy important? Keeping your bones healthy is important because your body constantly replaces bone cells. Cells get old, and new cells take their place. As we age, we lose bone cells because the body may not be able to make enough new cells to replace the old cells. The amount of bone cells and bone tissue you have is referred to as bone mass. The higher your bone mass, the stronger your bones. The aging process leads to an overall loss of bone mass in the body, which can increase the likelihood of:  Joint pain and stiffness.  Broken bones.  A condition in which the bones become weak and brittle (osteoporosis). A large decline in bone mass occurs  in older adults. In women, it occurs about the time of menopause.   What actions can I take to keep my bones healthy? Good health habits are important for maintaining healthy bones. This includes eating nutritious foods and exercising regularly. To have healthy bones, you need to get enough of the right minerals and vitamins. Most nutrition experts recommend getting these nutrients from the foods that you eat. In some cases, taking supplements may also be recommended. Doing certain types of exercise is also important for bone health. What are the nutritional recommendations for healthy bones? Eating a well-balanced diet with plenty of calcium and vitamin D will help to protect your bones. Nutritional recommendations vary from person to person. Ask your health care provider what is healthy for you. Here are some general guidelines. Get enough calcium Calcium is the most important (essential) mineral for bone health. Most people can get  enough calcium from their diet, but supplements may be recommended for people who are at risk for osteoporosis. Good sources of calcium include:  Dairy products, such as low-fat or nonfat milk, cheese, and yogurt.  Dark green leafy vegetables, such as bok choy and broccoli.  Calcium-fortified foods, such as orange juice, cereal, bread, soy beverages, and tofu products.  Nuts, such as almonds. Follow these recommended amounts for daily calcium intake:  Children, age 496-3: 700 mg.  Children, age 49-8: 1,000 mg.  Children, age 76-13: 1,300 mg.  Teens, age 63-18: 1,300 mg.  Adults, age 88-50: 1,000 mg.  Adults, age 69-70: ? Men: 1,000 mg. ? Women: 1,200 mg.  Adults, age 2 or older: 1,200 mg.  Pregnant and breastfeeding females: ? Teens: 1,300 mg. ? Adults: 1,000 mg. Get enough vitamin D Vitamin D is the most essential vitamin for bone health. It helps the body absorb calcium. Sunlight stimulates the skin to make vitamin D, so be sure to get enough sunlight. If you live in a cold climate or you do not get outside often, your health care provider may recommend that you take vitamin D supplements. Good sources of vitamin D in your diet include:  Egg yolks.  Saltwater fish.  Milk and cereal fortified with vitamin D. Follow these recommended amounts for daily vitamin D intake:  Children and teens, age 496-18: 600 international units.  Adults, age 494 or younger: 400-800 international units.  Adults, age 52 or older: 800-1,000 international units. Get other important nutrients Other nutrients that are important for bone health include:  Phosphorus. This mineral is found in meat, poultry, dairy foods, nuts, and legumes. The recommended daily intake for adult men and adult women is 700 mg.  Magnesium. This mineral is found in seeds, nuts, dark green vegetables, and legumes. The recommended daily intake for adult men is 400-420 mg. For adult women, it is 310-320 mg.  Vitamin K. This  vitamin is found in green leafy vegetables. The recommended daily intake is 120 mg for adult men and 90 mg for adult women.   What type of physical activity is best for building and maintaining healthy bones? Weight-bearing and strength-building activities are important for building and maintaining healthy bones. Weight-bearing activities cause muscles and bones to work against gravity. Strength-building activities increase the strength of the muscles that support bones. Weight-bearing and muscle-building activities include:  Walking and hiking.  Jogging and running.  Dancing.  Gym exercises.  Lifting weights.  Tennis and racquetball.  Climbing stairs.  Aerobics. Adults should get at least 30 minutes of moderate physical activity on most  days. Children should get at least 60 minutes of moderate physical activity on most days. Ask your health care provider what type of exercise is best for you.   How can I find out if my bone mass is low? Bone mass can be measured with an X-ray test called a bone mineral density (BMD) test. This test is recommended for all women who are age 39 or older. It may also be recommended for:  Men who are age 76 or older.  People who are at risk for osteoporosis because of: ? Having bones that break easily. ? Having a long-term disease that weakens bones, such as kidney disease or rheumatoid arthritis. ? Having menopause earlier than normal. ? Taking medicine that weakens bones, such as steroids, thyroid hormones, or hormone treatment for breast cancer or prostate cancer. ? Smoking. ? Drinking three or more alcoholic drinks a day. If you find that you have a low bone mass, you may be able to prevent osteoporosis or further bone loss by changing your diet and lifestyle. Where can I find more information? For more information, check out the following websites:  National Osteoporosis Foundation: https://carlson-fletcher.info/  [employer] of [employer]:  www.bones.http://www.myers.net/  International Osteoporosis Foundation: Investment banker, operational.iofbonehealth.org Summary  The aging process leads to an overall loss of bone mass in the body, which can increase the likelihood of broken bones and osteoporosis.  Eating a well-balanced diet with plenty of calcium and vitamin D will help to protect your bones.  Weight-bearing and strength-building activities are also important for building and maintaining strong bones.  Bone mass can be measured with an X-ray test called a bone mineral density (BMD) test. This information is not intended to replace advice given to you by your health care provider. Make sure you discuss any questions you have with your health care provider. Document Revised: 10/09/2017 Document Reviewed: 10/09/2017 Elsevier Patient Education  2021 Elsevier Inc.   Managing Anxiety, Adult After being diagnosed with an anxiety disorder, you may be relieved to know why you have felt or behaved a certain way. You may also feel overwhelmed about the treatment ahead and what it will mean for your life. With care and support, you can manage this condition and recover from it. How to manage lifestyle changes Managing stress and anxiety Stress is your body's reaction to life changes and events, both good and bad. Most stress will last just a few hours, but stress can be ongoing and can lead to more than just stress. Although stress can play a major role in anxiety, it is not the same as anxiety. Stress is usually caused by something external, such as a deadline, test, or competition. Stress normally passes after the triggering event has ended.  Anxiety is caused by something internal, such as imagining a terrible outcome or worrying that something will go wrong that will devastate you. Anxiety often does not go away even after the triggering event is over, and it can become long-term (chronic) worry. It is important to understand the differences between stress and anxiety  and to manage your stress effectively so that it does not lead to an anxious response. Talk with your health care provider or a counselor to learn more about reducing anxiety and stress. He or she may suggest tension reduction techniques, such as:  Music therapy. This can include creating or listening to music that you enjoy and that inspires you.  Mindfulness-based meditation. This involves being aware of your normal breaths while not trying to control  your breathing. It can be done while sitting or walking.  Centering prayer. This involves focusing on a word, phrase, or sacred image that means something to you and brings you peace.  Deep breathing. To do this, expand your stomach and inhale slowly through your nose. Hold your breath for 3-5 seconds. Then exhale slowly, letting your stomach muscles relax.  Self-talk. This involves identifying thought patterns that lead to anxiety reactions and changing those patterns.  Muscle relaxation. This involves tensing muscles and then relaxing them. Choose a tension reduction technique that suits your lifestyle and personality. These techniques take time and practice. Set aside 5-15 minutes a day to do them. Therapists can offer counseling and training in these techniques. The training to help with anxiety may be covered by some insurance plans. Other things you can do to manage stress and anxiety include:  Keeping a stress/anxiety diary. This can help you learn what triggers your reaction and then learn ways to manage your response.  Thinking about how you react to certain situations. You may not be able to control everything, but you can control your response.  Making time for activities that help you relax and not feeling guilty about spending your time in this way.  Visual imagery and yoga can help you stay calm and relax.   Medicines Medicines can help ease symptoms. Medicines for anxiety include:  Anti-anxiety  drugs.  Antidepressants. Medicines are often used as a primary treatment for anxiety disorder. Medicines will be prescribed by a health care provider. When used together, medicines, psychotherapy, and tension reduction techniques may be the most effective treatment. Relationships Relationships can play a big part in helping you recover. Try to spend more time connecting with trusted friends and family members. Consider going to couples counseling, taking family education classes, or going to family therapy. Therapy can help you and others better understand your condition. How to recognize changes in your anxiety Everyone responds differently to treatment for anxiety. Recovery from anxiety happens when symptoms decrease and stop interfering with your daily activities at home or work. This may mean that you will start to:  Have better concentration and focus. Worry will interfere less in your daily thinking.  Sleep better.  Be less irritable.  Have more energy.  Have improved memory. It is important to recognize when your condition is getting worse. Contact your health care provider if your symptoms interfere with home or work and you feel like your condition is not improving. Follow these instructions at home: Activity  Exercise. Most adults should do the following: ? Exercise for at least 150 minutes each week. The exercise should increase your heart rate and make you sweat (moderate-intensity exercise). ? Strengthening exercises at least twice a week.  Get the right amount and quality of sleep. Most adults need 7-9 hours of sleep each night. Lifestyle  Eat a healthy diet that includes plenty of vegetables, fruits, whole grains, low-fat dairy products, and lean protein. Do not eat a lot of foods that are high in solid fats, added sugars, or salt.  Make choices that simplify your life.  Do not use any products that contain nicotine or tobacco, such as cigarettes, e-cigarettes, and  chewing tobacco. If you need help quitting, ask your health care provider.  Avoid caffeine, alcohol, and certain over-the-counter cold medicines. These may make you feel worse. Ask your pharmacist which medicines to avoid.   General instructions  Take over-the-counter and prescription medicines only as told by your health care  provider.  Keep all follow-up visits as told by your health care provider. This is important. Where to find support You can get help and support from these sources:  Self-help groups.  Online and [employer].  A trusted spiritual leader.  Couples counseling.  Family education classes.  Family therapy. Where to find more information You may find that joining a support group helps you deal with your anxiety. The following sources can help you locate counselors or support groups near you:  Mental Health America: www.mentalhealthamerica.net  Anxiety and Depression Association of [place] (ADAA): ProgramCam.de  [employer] on Mental Illness (NAMI): www.nami.org Contact a health care provider if you:  Have a hard time staying focused or finishing daily tasks.  Spend many hours a day feeling worried about everyday life.  Become exhausted by worry.  Start to have headaches, feel tense, or have nausea.  Urinate more than normal.  Have diarrhea. Get help right away if you have:  A racing heart and shortness of breath.  Thoughts of hurting yourself or others. If you ever feel like you may hurt yourself or others, or have thoughts about taking your own life, get help right away. You can go to your nearest emergency department or call:  Your local emergency services (911 in the [place]  A suicide crisis helpline, such as the National Suicide Prevention Lifeline at 567-762-5011. This is open 24 hours a day. Summary  Taking steps to learn and use tension reduction techniques can help calm you and help prevent triggering an anxiety  reaction.  When used together, medicines, psychotherapy, and tension reduction techniques may be the most effective treatment.  Family, friends, and partners can play a big part in helping you recover from an anxiety disorder. This information is not intended to replace advice given to you by your health care provider. Make sure you discuss any questions you have with your health care provider. Document Revised: 02/12/2019 Document Reviewed: 02/12/2019 Elsevier Patient Education  2021 Elsevier Inc.     Therapists/Counselors/Psychologists  [facility], [facility] [address] [place], [place] [address] [phone]  Karen [place], [place]  & Jacqlyn Krauss [place]    [phone]        [address]       [place], [place] [address]        Ival Bible, CSW [phone] [address] [place], [place] [address]  Harle Battiest, [place]        [phone]        [address]      [place], [place] [address]        Chyrel Masson, MS [phone] [address] [place], [place] [address]   Oscar La, LMFT       [phone]        [address]       [place], [place] [address]        Felecia Jan [phone] [address] [place], [place] [address]  [place]        [phone]        [address]       [place], [place] [address]        Kerin Salen [phone] [address] [place], [place] [address]  Tyron Russell, PsyD       [phone]        [address]       [place], [place] [address]  Elita Quick, LPC [phone] [address]  [place], [place] [address]   Debarah Crape        [phone]        [address] [place], [place] [address]         Rosana Hoes [facility] [phone] [email or web address]   Sation Konchella       [phone]        [address]       [place], [place] [address]        Morton [address] [facility] [phone] [email or web address]

## 2020-10-30 NOTE — Progress Notes (Signed)
Annual Exam

## 2020-11-02 ENCOUNTER — Encounter: Payer: Self-pay | Admitting: Obstetrics and Gynecology

## 2020-11-04 LAB — CYTOLOGY - PAP
Comment: NEGATIVE
Diagnosis: NEGATIVE
High risk HPV: NEGATIVE

## 2020-11-05 NOTE — Progress Notes (Signed)
Please let patient know about normal pap smear- thank you!

## 2020-11-10 ENCOUNTER — Telehealth: Payer: Self-pay

## 2020-11-10 NOTE — Telephone Encounter (Signed)
Pt calling for results of pap smear; ok to leave msg.  [phone]  Pt aware pap is normal.

## 2020-11-11 ENCOUNTER — Other Ambulatory Visit: Payer: Self-pay

## 2020-11-11 ENCOUNTER — Telehealth (INDEPENDENT_AMBULATORY_CARE_PROVIDER_SITE_OTHER): Payer: Self-pay | Admitting: Gastroenterology

## 2020-11-11 DIAGNOSIS — J310 Chronic rhinitis: Secondary | ICD-10-CM | POA: Insufficient documentation

## 2020-11-11 DIAGNOSIS — Z8669 Personal history of other diseases of the nervous system and sense organs: Secondary | ICD-10-CM | POA: Insufficient documentation

## 2020-11-11 DIAGNOSIS — F325 Major depressive disorder, single episode, in full remission: Secondary | ICD-10-CM | POA: Insufficient documentation

## 2020-11-11 DIAGNOSIS — Z8719 Personal history of other diseases of the digestive system: Secondary | ICD-10-CM

## 2020-11-11 DIAGNOSIS — N951 Menopausal and female climacteric states: Secondary | ICD-10-CM | POA: Insufficient documentation

## 2020-11-11 MED ORDER — NA SULFATE-K SULFATE-MG SULF 17.5-3.13-1.6 GM/177ML PO SOLN: 1.0000 | Freq: Once | ORAL | 0 refills | Status: AC

## 2020-11-11 NOTE — Progress Notes (Signed)
Gastroenterology Pre-Procedure Review  Request Date: Thursday 11/19/20 Requesting Physician: Dr. Ronelle Nigh  PATIENT REVIEW QUESTIONS: The patient responded to the following health history questions as indicated:    1. Are you having any GI issues? pt states she had hemorrhoids about 2 years ago 2. Do you have a personal history of Polyps? yes (rectal polyp noted on colonoscopy 07/06/15 performed by Dr. Rayann Heman) 3. Do you have a family history of Colon Cancer or Polyps? yes (twin sister, and father colon polyps) 4. Diabetes Mellitus? no 5. Joint replacements in the past 12 months?no 6. Major health problems in the past 3 months?no 7. Any artificial heart valves, MVP, or defibrillator?no    MEDICATIONS & ALLERGIES:    Patient reports the following regarding taking any anticoagulation/antiplatelet therapy:   Plavix, Coumadin, Eliquis, Xarelto, Lovenox, Pradaxa, Brilinta, or Effient? no Aspirin? no  Patient confirms/reports the following medications:  Current Outpatient Medications  Medication Sig Dispense Refill  . Na Sulfate-K Sulfate-Mg Sulf 17.5-3.13-1.6 GM/177ML SOLN Take 1 kit by mouth once for 1 dose. 354 mL 0   No current facility-administered medications for this visit.    Patient confirms/reports the following allergies:  Allergies  Allergen Reactions  . Effexor [Venlafaxine] Other (See Comments)    Vomiting, nausea; could not sleep; felt things on scalp; weakness  . Paroxetine Hcl Other (See Comments)    Felt "terrible", weakness, felt depressed  . Penicillins Other (See Comments)    Orders Placed This Encounter  Procedures  . Procedural/ Surgical Case Request: COLONOSCOPY WITH PROPOFOL    Standing Status:   Standing    Number of Occurrences:   1    Order Specific Question:   Pre-op diagnosis    Answer:   history of rectal polyp    Order Specific Question:   CPT Code    Answer:   60677    AUTHORIZATION INFORMATION Primary Insurance: 1D#: Group #:  Secondary  Insurance: 1D#: Group #:  SCHEDULE INFORMATION: Date: 11/19/20 Time: Location:[facility]

## 2020-11-16 ENCOUNTER — Other Ambulatory Visit: Payer: Self-pay

## 2020-11-16 ENCOUNTER — Telehealth: Payer: Self-pay

## 2020-11-16 DIAGNOSIS — Z8719 Personal history of other diseases of the digestive system: Secondary | ICD-10-CM

## 2020-11-16 NOTE — Telephone Encounter (Signed)
Patients colonoscopy has been rescheduled to 12/10/20 with Dr. Maximino Greenland due to death in family.  Pt has been advised of COVID testing date Tuesday 12/08/20.  Thanks,  [place], [place]

## 2020-11-16 NOTE — Telephone Encounter (Signed)
Pt has been notified that her Pap is normal.

## 2020-11-17 ENCOUNTER — Other Ambulatory Visit: Payer: BC Managed Care – PPO | Attending: Family Medicine

## 2020-12-04 ENCOUNTER — Other Ambulatory Visit: Payer: BC Managed Care – PPO

## 2020-12-08 ENCOUNTER — Telehealth: Payer: Self-pay

## 2020-12-08 ENCOUNTER — Other Ambulatory Visit: Admission: RE | Admit: 2020-12-08 | Payer: BC Managed Care – PPO | Source: Ambulatory Visit

## 2020-12-08 NOTE — Telephone Encounter (Signed)
Returned patients call. Spoke with pt she is currently sick and needs to reschedule. Endo unit has been contacted regarding moving patients procedure date. Pt verbalized understanding.

## 2020-12-18 ENCOUNTER — Other Ambulatory Visit (HOSPITAL_COMMUNITY): Payer: Self-pay | Admitting: Orthopedic Surgery

## 2020-12-18 ENCOUNTER — Other Ambulatory Visit: Payer: Self-pay | Admitting: Orthopedic Surgery

## 2020-12-18 DIAGNOSIS — S22000A Wedge compression fracture of unspecified thoracic vertebra, initial encounter for closed fracture: Secondary | ICD-10-CM

## 2020-12-21 ENCOUNTER — Other Ambulatory Visit
Admission: RE | Admit: 2020-12-21 | Discharge: 2020-12-21 | Disposition: A | Discharge status: Home / Self Care | Payer: BC Managed Care – PPO | Source: Ambulatory Visit | Attending: Gastroenterology | Admitting: Gastroenterology

## 2020-12-21 ENCOUNTER — Other Ambulatory Visit: Payer: Self-pay

## 2020-12-21 ENCOUNTER — Ambulatory Visit: Payer: BC Managed Care – PPO

## 2020-12-21 DIAGNOSIS — Z880 Allergy status to penicillin: Secondary | ICD-10-CM | POA: Diagnosis not present

## 2020-12-21 DIAGNOSIS — Z20822 Contact with and (suspected) exposure to covid-19: Secondary | ICD-10-CM | POA: Insufficient documentation

## 2020-12-21 DIAGNOSIS — Z888 Allergy status to other drugs, medicaments and biological substances status: Secondary | ICD-10-CM | POA: Diagnosis not present

## 2020-12-21 DIAGNOSIS — Z01812 Encounter for preprocedural laboratory examination: Secondary | ICD-10-CM | POA: Insufficient documentation

## 2020-12-21 DIAGNOSIS — Z1211 Encounter for screening for malignant neoplasm of colon: Secondary | ICD-10-CM | POA: Diagnosis not present

## 2020-12-21 DIAGNOSIS — K5730 Diverticulosis of large intestine without perforation or abscess without bleeding: Secondary | ICD-10-CM | POA: Diagnosis not present

## 2020-12-21 DIAGNOSIS — Z8371 Family history of colonic polyps: Secondary | ICD-10-CM | POA: Diagnosis not present

## 2020-12-21 DIAGNOSIS — K635 Polyp of colon: Secondary | ICD-10-CM | POA: Diagnosis not present

## 2020-12-22 LAB — SARS CORONAVIRUS 2 (TAT 6-24 HRS): SARS Coronavirus 2: NEGATIVE

## 2020-12-23 ENCOUNTER — Ambulatory Visit
Admission: RE | Admit: 2020-12-23 | Discharge: 2020-12-23 | Disposition: A | Discharge status: Home / Self Care | Payer: BC Managed Care – PPO | Attending: Gastroenterology | Admitting: Gastroenterology

## 2020-12-23 ENCOUNTER — Ambulatory Visit: Payer: BC Managed Care – PPO | Admitting: Anesthesiology

## 2020-12-23 ENCOUNTER — Encounter: Payer: Self-pay | Admitting: Gastroenterology

## 2020-12-23 ENCOUNTER — Encounter
Admission: RE | Disposition: A | Discharge status: Home / Self Care | Payer: Self-pay | Source: Home / Self Care | Attending: Gastroenterology

## 2020-12-23 ENCOUNTER — Other Ambulatory Visit: Payer: Self-pay

## 2020-12-23 DIAGNOSIS — Z1211 Encounter for screening for malignant neoplasm of colon: Secondary | ICD-10-CM | POA: Diagnosis not present

## 2020-12-23 DIAGNOSIS — Z888 Allergy status to other drugs, medicaments and biological substances status: Secondary | ICD-10-CM | POA: Insufficient documentation

## 2020-12-23 DIAGNOSIS — K635 Polyp of colon: Secondary | ICD-10-CM | POA: Diagnosis not present

## 2020-12-23 DIAGNOSIS — Z8719 Personal history of other diseases of the digestive system: Secondary | ICD-10-CM

## 2020-12-23 DIAGNOSIS — Z880 Allergy status to penicillin: Secondary | ICD-10-CM | POA: Insufficient documentation

## 2020-12-23 DIAGNOSIS — Z20822 Contact with and (suspected) exposure to covid-19: Secondary | ICD-10-CM | POA: Insufficient documentation

## 2020-12-23 DIAGNOSIS — K5730 Diverticulosis of large intestine without perforation or abscess without bleeding: Secondary | ICD-10-CM | POA: Insufficient documentation

## 2020-12-23 DIAGNOSIS — Z8371 Family history of colonic polyps: Secondary | ICD-10-CM | POA: Insufficient documentation

## 2020-12-23 HISTORY — PX: COLONOSCOPY WITH PROPOFOL: SHX5780

## 2020-12-23 SURGERY — COLONOSCOPY WITH PROPOFOL
Anesthesia: General

## 2020-12-23 MED ORDER — PROPOFOL 10 MG/ML IV BOLUS
INTRAVENOUS | Status: DC | PRN
  Administered 2020-12-23: 20 mg via INTRAVENOUS
  Administered 2020-12-23: 80 mg via INTRAVENOUS

## 2020-12-23 MED ORDER — PROPOFOL 500 MG/50ML IV EMUL
INTRAVENOUS | Status: AC
  Filled 2020-12-23: qty 50

## 2020-12-23 MED ORDER — LIDOCAINE HCL (CARDIAC) PF 100 MG/5ML IV SOSY
PREFILLED_SYRINGE | INTRAVENOUS | Status: DC | PRN
  Administered 2020-12-23: 50 mg via INTRAVENOUS

## 2020-12-23 MED ORDER — PROPOFOL 500 MG/50ML IV EMUL
INTRAVENOUS | Status: DC | PRN
  Administered 2020-12-23: 150 ug/kg/min via INTRAVENOUS

## 2020-12-23 MED ORDER — SODIUM CHLORIDE 0.9 % IV SOLN: INTRAVENOUS | Status: DC

## 2020-12-23 NOTE — Transfer of Care (Signed)
Immediate Anesthesia Transfer of Care Note  Patient: Christine Cardenas  Procedure(s) Performed: COLONOSCOPY WITH PROPOFOL (N/A )  Patient Location: PACU  Anesthesia Type:General  Level of Consciousness: sedated  Airway & Oxygen Therapy: Patient Spontanous Breathing  Post-op Assessment: Report given to RN and Post -op Vital signs reviewed and stable  Post vital signs: Reviewed and stable  Last Vitals:  Vitals Value Taken Time  BP 119/83 12/23/20 0907  Temp    Pulse 67 12/23/20 0907  Resp 18 12/23/20 0907  SpO2 100 % 12/23/20 0907    Last Pain:  Vitals:   12/23/20 0745  TempSrc: Temporal  PainSc: 0-No pain         Complications: No complications documented.

## 2020-12-23 NOTE — H&P (Signed)
Christine Bouillon, MD [address], [address], [place], [place], [address] [address], [place], [place], [address] Phone: [phone]  Fax: [phone]  Primary Care Physician:  Marisue Ivan, MD   Pre-Procedure History & Physical: HPI:  Christine Cardenas is a 60 y.o. female is here for a colonoscopy.   Past Medical History:  Diagnosis Date  . Chronic rhinitis   . Headache    migaines  . History of mammogram 07/2015   WNL  . History of Papanicolaou smear of cervix >12YRS; 12/23/15   NO HX OF ABNL; -/-  . Menopause   . UTI (urinary tract infection)     Past Surgical History:  Procedure Laterality Date  . ABDOMINAL HYSTERECTOMY  2011   DR. EVANS, ENDOMATRIOSIS  . AUGMENTATION MAMMAPLASTY Bilateral 2006  . AUGMENTATION MAMMAPLASTY Bilateral 10/2019   breast lift with new breast implants bigger implants than before  . BREAST SURGERY     implants  . COLONOSCOPY WITH PROPOFOL N/A 07/06/2015   Procedure: COLONOSCOPY WITH PROPOFOL;  Surgeon: Elnita Maxwell, MD;  Location: [facility] ENDOSCOPY;  Service: Endoscopy;  Laterality: N/A;  . HERNIA REPAIR     x2  . left foot bone removed    . TONSILLECTOMY      Prior to Admission medications   Not on File    Allergies as of 11/11/2020 - Review Complete 11/11/2020  Allergen Reaction Noted  . Effexor [venlafaxine] Other (See Comments) 04/17/2014  . Paroxetine hcl Other (See Comments) 08/04/2015  . Penicillins Other (See Comments) 07/03/2015    Family History  Problem Relation Age of Onset  . Breast cancer Neg Hx   . Hypertension Neg Hx   . Hyperlipidemia Neg Hx   . Diabetes Neg Hx   . Cancer Neg Hx     Social History   Socioeconomic History  . Marital status: Married    Spouse name: Not on file  . Number of children: Not on file  . Years of education: Not on file  . Highest education level: Not on file  Occupational History  . Not on file  Tobacco Use  . Smoking status: Never Smoker  . Smokeless  tobacco: Never Used  Vaping Use  . Vaping Use: Never used  Substance and Sexual Activity  . Alcohol use: No  . Drug use: No  . Sexual activity: Yes    Birth control/protection: Post-menopausal  Other Topics Concern  . Not on file  Social History Narrative  . Not on file   Social Determinants of Health   Financial Resource Strain: Not on file  Food Insecurity: Not on file  Transportation Needs: Not on file  Physical Activity: Not on file  Stress: Not on file  Social Connections: Not on file  Intimate Partner Violence: Not on file    Review of Systems: See HPI, otherwise negative ROS  Physical Exam: BP (!) 134/95   Pulse 78   Temp (!) 97.4 F (36.3 C) (Temporal)   Resp 20   Ht 5' 0.5" (1.537 m)   Wt 57.2 kg   SpO2 100%   BMI 24.20 kg/m  General:   Alert,  pleasant and cooperative in NAD Head:  Normocephalic and atraumatic. Neck:  Supple; no masses or thyromegaly. Lungs:  Clear throughout to auscultation, normal respiratory effort.    Heart:  +S1, +S2, Regular rate and rhythm, No edema. Abdomen:  Soft, nontender and nondistended. Normal bowel sounds, without guarding, and without rebound.   Neurologic:  Alert  and  oriented x4;  grossly normal neurologically.  Impression/Plan: Christine Cardenas is here for a colonoscopy to be performed for average risk screening  Risks, benefits, limitations, and alternatives regarding  colonoscopy have been reviewed with the patient.  Questions have been answered.  All parties agreeable.   Pasty Spillers, MD  12/23/2020, 8:35 AM

## 2020-12-23 NOTE — Anesthesia Preprocedure Evaluation (Signed)
Anesthesia Evaluation  Patient identified by MRN, date of birth, ID band Patient awake    Reviewed: Allergy & Precautions, H&P , NPO status , Patient's Chart, lab work & pertinent test results  History of Anesthesia Complications Negative for: history of anesthetic complications  Airway Mallampati: II  TM Distance: >3 FB Neck ROM: full    Dental no notable dental hx. (+) Teeth Intact   Pulmonary neg pulmonary ROS, neg shortness of breath,    Pulmonary exam normal breath sounds clear to auscultation       Cardiovascular Exercise Tolerance: Good (-) Past MI negative cardio ROS Normal cardiovascular exam Rhythm:regular Rate:Normal     Neuro/Psych  Headaches, PSYCHIATRIC DISORDERS Depression negative psych ROS   GI/Hepatic negative GI ROS, Neg liver ROS,   Endo/Other  negative endocrine ROS  Renal/GU negative Renal ROS  negative genitourinary   Musculoskeletal negative musculoskeletal ROS (+)   Abdominal   Peds negative pediatric ROS (+)  Hematology negative hematology ROS (+)   Anesthesia Other Findings Past Medical History:   Headache                                                       Comment:migaines   Chronic rhinitis                                             Depression                                                     Comment:in remission   Menopause                                                   BMI    Body Mass Index   22.30 kg/m 2      Reproductive/Obstetrics negative OB ROS                             Anesthesia Physical  Anesthesia Plan  ASA: II  Anesthesia Plan: General   Post-op Pain Management:    Induction:   PONV Risk Score and Plan: Propofol infusion  Airway Management Planned: Nasal Cannula  Additional Equipment:   Intra-op Plan:   Post-operative Plan:   Informed Consent: I have reviewed the patients History and Physical, chart, labs and  discussed the procedure including the risks, benefits and alternatives for the proposed anesthesia with the patient or authorized representative who has indicated his/her understanding and acceptance.     Dental Advisory Given  Plan Discussed with: Anesthesiologist, CRNA and Surgeon  Anesthesia Plan Comments:         Anesthesia Quick Evaluation

## 2020-12-23 NOTE — Op Note (Addendum)
[facility] Patient Name: Christine Cardenas Procedure Date: 12/23/2020 8:25 AM MRN: [id number] Account #: [id number] Date of Birth: 11-30-1960 Admit Type: Outpatient Age: 60 Room: [facility] ENDO ROOM 3 Gender: Female Note Status: Finalized Procedure:             Colonoscopy Indications:           Screening for colorectal malignant neoplasm, Pt denies                         any history of colon cancer in any of her first or                         second degree relatives. She report her 61 year old                         father has had polyps, but that there has been no                         family history of colon cancer in her family. She was                         advised to let our clinic or her PCP know if this                         family history changes in the future. Providers:             Dolphus Jenny. Maximino Greenland MD, MD Medicines:             Monitored Anesthesia Care Complications:         No immediate complications. Procedure:             Pre-Anesthesia Assessment:                        - ASA Grade Assessment: II - A patient with mild                         systemic disease.                        - Prior to the procedure, a History and Physical was                         performed, and patient medications, allergies and                         sensitivities were reviewed. The patient's tolerance                         of previous anesthesia was reviewed.                        - The risks and benefits of the procedure and the                         sedation options and risks were discussed with the  patient. All questions were answered and informed                         consent was obtained.                        - Patient identification and proposed procedure were                         verified prior to the procedure by the physician, the                         nurse, the anesthesiologist, the anesthetist and  the                         technician. The procedure was verified in the                         procedure room.                        After obtaining informed consent, the colonoscope was                         passed under direct vision. Throughout the procedure,                         the patient's blood pressure, pulse, and oxygen                         saturations were monitored continuously. The                         Colonoscope was introduced through the anus and                         advanced to the the cecum, identified by appendiceal                         orifice and ileocecal valve. The colonoscopy was                         performed with ease. The patient tolerated the                         procedure well. The quality of the bowel preparation                         was good. Findings:      The perianal and digital rectal examinations were normal.      Two sessile polyps were found in the sigmoid colon and cecum. The polyps       were 2 to 3 mm in size. These polyps were removed with a jumbo cold       forceps. Resection and retrieval were complete.      Multiple diverticula were found in the sigmoid colon.      The exam was otherwise without abnormality.      The rectum, sigmoid colon, descending colon, transverse colon, ascending       colon  and cecum appeared normal.      The retroflexed view of the distal rectum and anal verge was normal and       showed no anal or rectal abnormalities. Impression:            - Two 2 to 3 mm polyps in the sigmoid colon and in the                         cecum, removed with a jumbo cold forceps. Resected and                         retrieved.                        - Diverticulosis in the sigmoid colon.                        - The examination was otherwise normal.                        - The rectum, sigmoid colon, descending colon,                         transverse colon, ascending colon and cecum are normal.                         - The distal rectum and anal verge are normal on                         retroflexion view. Recommendation:        - Discharge patient to home (with escort).                        - High fiber diet.                        - Advance diet as tolerated.                        - Continue present medications.                        - Await pathology results.                        - Repeat colonoscopy date to be determined after                         pending pathology results are reviewed.                        - The findings and recommendations were discussed with                         the patient.                        - The findings and recommendations were discussed with                         the patient's family.                        -  Return to primary care physician as previously                         scheduled. Procedure Code(s):     --- Professional ---                        [id number], Colonoscopy, flexible; with biopsy, single or                         multiple Diagnosis Code(s):     --- Professional ---                        Z12.11, Encounter for screening for malignant neoplasm                         of colon                        K63.5, Polyp of colon CPT copyright 2019 American Medical Association. All rights reserved. The codes documented in this report are preliminary and upon coder review may  be revised to meet current compliance requirements.  Melodie BouillonVarnita Rosamaria Donn, MD Michel BickersVarnita B. Maximino Greenlandahiliani MD, MD 12/23/2020 9:07:37 AM This report has been signed electronically. Number of Addenda: 0 Note Initiated On: 12/23/2020 8:25 AM Scope Withdrawal Time: 0 hours 19 minutes 24 seconds  Total Procedure Duration: 0 hours 23 minutes 27 seconds  Estimated Blood Loss:  Estimated blood loss: none.      [facility]

## 2020-12-23 NOTE — Anesthesia Procedure Notes (Signed)
Date/Time: 12/23/2020 8:32 AM Performed by: Ginger Carne, CRNA Pre-anesthesia Checklist: Patient identified, Emergency Drugs available, Suction available and Patient being monitored Patient Re-evaluated:Patient Re-evaluated prior to induction Oxygen Delivery Method: Nasal cannula Preoxygenation: Pre-oxygenation with 100% oxygen Induction Type: IV induction

## 2020-12-24 ENCOUNTER — Encounter: Payer: Self-pay | Admitting: Gastroenterology

## 2020-12-24 LAB — SURGICAL PATHOLOGY

## 2020-12-24 NOTE — Anesthesia Postprocedure Evaluation (Signed)
Anesthesia Post Note  Patient: Christine Cardenas  Procedure(s) Performed: COLONOSCOPY WITH PROPOFOL (N/A )  Patient location during evaluation: Endoscopy Anesthesia Type: General Level of consciousness: awake and alert and oriented Pain management: pain level controlled Vital Signs Assessment: post-procedure vital signs reviewed and stable Respiratory status: spontaneous breathing Cardiovascular status: blood pressure returned to baseline Anesthetic complications: no   No complications documented.   Last Vitals:  Vitals:   12/23/20 0917 12/23/20 0927  BP: 124/82 (!) 132/91  Pulse:  60  Resp: 19 13  Temp:    SpO2: 100% 100%    Last Pain:  Vitals:   12/24/20 0743  TempSrc:   PainSc: 0-No pain                 Ritik Stavola

## 2021-01-01 ENCOUNTER — Encounter: Payer: Self-pay | Admitting: Gastroenterology

## 2021-07-26 ENCOUNTER — Other Ambulatory Visit: Payer: Self-pay | Admitting: Family Medicine

## 2021-07-26 DIAGNOSIS — Z1231 Encounter for screening mammogram for malignant neoplasm of breast: Secondary | ICD-10-CM

## 2021-08-13 ENCOUNTER — Other Ambulatory Visit: Payer: Self-pay

## 2021-08-13 ENCOUNTER — Ambulatory Visit
Admission: RE | Admit: 2021-08-13 | Discharge: 2021-08-13 | Disposition: A | Discharge status: Home / Self Care | Payer: BC Managed Care – PPO | Source: Ambulatory Visit | Attending: Family Medicine | Admitting: Family Medicine

## 2021-08-13 DIAGNOSIS — Z1231 Encounter for screening mammogram for malignant neoplasm of breast: Secondary | ICD-10-CM | POA: Diagnosis present

## 2021-11-26 ENCOUNTER — Encounter: Payer: BC Managed Care – PPO | Attending: Sports Medicine | Admitting: Dietician

## 2021-11-26 ENCOUNTER — Encounter: Payer: Self-pay | Admitting: Dietician

## 2021-11-26 ENCOUNTER — Other Ambulatory Visit: Payer: Self-pay

## 2021-11-26 VITALS — Ht 60.0 in | Wt 129.3 lb

## 2021-11-26 DIAGNOSIS — M8588 Other specified disorders of bone density and structure, other site: Secondary | ICD-10-CM

## 2021-11-26 DIAGNOSIS — E7800 Pure hypercholesterolemia, unspecified: Secondary | ICD-10-CM | POA: Diagnosis not present

## 2021-11-26 DIAGNOSIS — E663 Overweight: Secondary | ICD-10-CM

## 2021-11-26 NOTE — Progress Notes (Signed)
Medical Nutrition Therapy: Visit start time: 0830  end time: 0930  ?Assessment:  Diagnosis: osteopenia of spine, elevated cholesterol, overweight ?Past medical history: hysterectomy at age 61 ?Psychosocial issues/ stress concerns: patient reports high stress level, caring for aging parents ? ?Preferred learning method:  ?Auditory ?Hands-on ? ? ?Current weight: 129.3lbs Height: 5'0" BMI: 25.25 ? ?Medications, supplements: no medications taken at this time; does take multivitamin supplement daily ? ?Progress and evaluation:  ?Patient reports  not much attention to nutrition in the past, more concerned recently due to advice to her father who has dementia, mother's hypertension, and patient's own bone density and cholesterol ?Husband enjoys eating red meats, patient has adopted some of his habits in past 3 years of marriage, and is now working to resume/ develop her own healthier habits. ?Patient reports her weight has increased about 9-10lbs since getting married 3 years ago. She also has had to decrease some physical activity due to pulled muscle in left leg. She was doing boot camp workouts; used to teach aerobic exercise classes. ?Recent lipid labs: total cholesterol 216, HDL 64.3, LDL 118, triglycerides 168 (02/17/21) ? ? ?Physical activity: elliptical/ cardio for 30 minutes and weights 15-30 minutes 3-4 times a week ? ?Dietary Intake:  ?Usual eating pattern includes 3 meals and 1-2 snacks per day. ?Dining out frequency: 4 meals per week. ? ?Breakfast: chobani yogurt zero sugar with granola and fruit ie banana or blueberries; occ eggs and ezekiel bread with honey;  ?Snack: none ?Lunch: sandwich on ezekiel bread with [place]; salad with protein ?Snack: hummus with pretzels ?Supper: salad with protein; spaghetti; salmon once a week; pizza with pineapple, salad; stiffed cabbage; roasted/ air fryer chicken, brussels sprouts; occ [place] with chips and salsa ?Snack: occ clementine; popcorn on weekends ?Beverages: water,  1/2 sweet tea; coffee with creamer flavored ? ?Nutrition Care Education: ?Topics covered:  ?Basic nutrition: basic food groups, appropriate nutrient balance, appropriate meal and snack schedule, general nutrition guidelines    ?Weight control: identifying healthy weight, importance of low sugar and low fat choices, portion control, estimated energy needs for gradual weight loss at 1300kcal, provided guidance for 45% CHO, 25% pro, 30% fat ?Hyperlipidemia:  target goals for lipids, healthy and unhealthy fats, role of fiber, role of exercise ?Bone health:  daily goal for calcium intake at 1200mg ; food sources of calcium and vitamin D ? ?Nutritional Diagnosis:  [place]-2.1 Inpaired nutrition utilization and [place]-2.2 Altered nutrition-related laboratory As related to osteopenia, elevated cholesterol.  As evidenced by bone density analysis; elevated total cholesterol and LDL above ideal of 100 or less. ?[place]-3.4 Unintentional weight gain As related to decline in physical activity, slight increase in caloric intake, iincreased stress.  As evidenced by patient with current BMI of 25 recently increased from 23.. ? ?Intervention:  ?Instruction and discussion as noted above. ?Patient in making appropriate and healthy food choices, and is motivated to continue.  ?She will increase calcium intake as needed to meet daily goal. ?No follow up needed at this time; offered future follow up as needed. ? ? given:  ?Plate Planner with food lists, sample meal pattern ?High Calcium Foods list (NCM) ?Visit summary with goals/ instructions to be viewed via MyChart ? ? ?Learner/ who was taught:  ?Patient  ? ? ?Level of understanding: ?Verbalizes/ demonstrates competency ? ? ?Demonstrated degree of understanding via:   Teach back ?Learning barriers: ?None ? ?Willingness to learn/ readiness for change: ?Eager, change in progress ? ? ?Monitoring and Evaluation:  Dietary intake, exercise, bone  density, blood lipids, and body  weight ?     follow up: prn  ?

## 2021-11-26 NOTE — Patient Instructions (Addendum)
Continue to make healthy food choices, great job! ?Aim for about 1300-1400 caloires daily for gradual weight loss. ?Include 1200mg  of calcium daily from food sources and supplement if needed. ?

## 2021-12-08 ENCOUNTER — Ambulatory Visit: Payer: BC Managed Care – PPO | Admitting: Dietician

## 2021-12-16 ENCOUNTER — Other Ambulatory Visit (HOSPITAL_COMMUNITY): Payer: Self-pay | Admitting: Sports Medicine

## 2021-12-16 ENCOUNTER — Other Ambulatory Visit: Payer: Self-pay | Admitting: Sports Medicine

## 2021-12-16 DIAGNOSIS — M5442 Lumbago with sciatica, left side: Secondary | ICD-10-CM

## 2021-12-16 DIAGNOSIS — M7918 Myalgia, other site: Secondary | ICD-10-CM

## 2021-12-16 DIAGNOSIS — M5137 Other intervertebral disc degeneration, lumbosacral region: Secondary | ICD-10-CM

## 2021-12-29 ENCOUNTER — Ambulatory Visit
Admission: RE | Admit: 2021-12-29 | Discharge: 2021-12-29 | Disposition: A | Discharge status: Home / Self Care | Payer: BC Managed Care – PPO | Source: Ambulatory Visit | Attending: Sports Medicine | Admitting: Sports Medicine

## 2021-12-29 DIAGNOSIS — M7918 Myalgia, other site: Secondary | ICD-10-CM | POA: Diagnosis present

## 2021-12-29 DIAGNOSIS — M5137 Other intervertebral disc degeneration, lumbosacral region: Secondary | ICD-10-CM | POA: Diagnosis present

## 2021-12-29 DIAGNOSIS — M5442 Lumbago with sciatica, left side: Secondary | ICD-10-CM | POA: Diagnosis present

## 2021-12-29 IMAGING — MG DIGITAL SCREENING BREAST BILAT IMPLANT W/ TOMO W/ CAD
8 of 13 series · 8 of 29 positions shown · non-contrast
Comparison: Previous exam(s).

CLINICAL DATA: Screening.

EXAM:
DIGITAL SCREENING BILATERAL MAMMOGRAM WITH IMPLANTS, CAD AND TOMO
The patient has retropectoral saline implants. Standard and implant
displaced views were performed.

[L CC (1 of 2)]
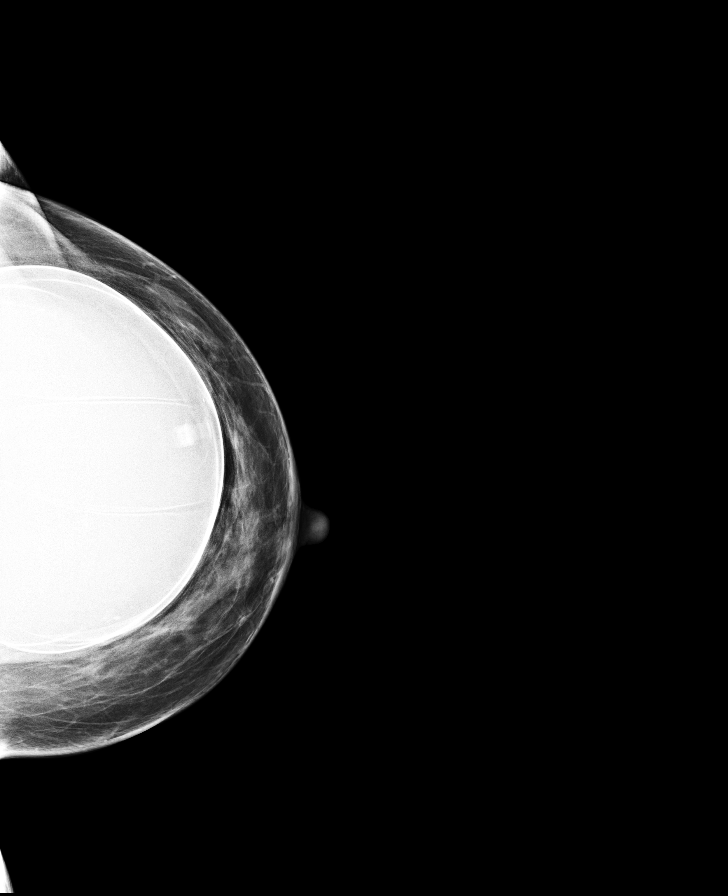

[R MLO]
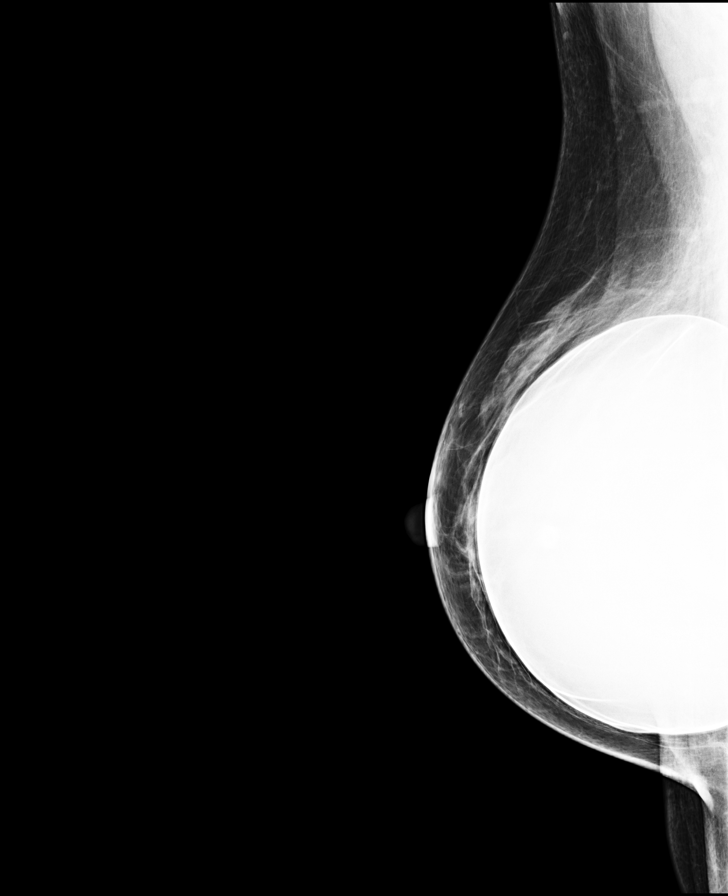

[L CC (2 of 2)]
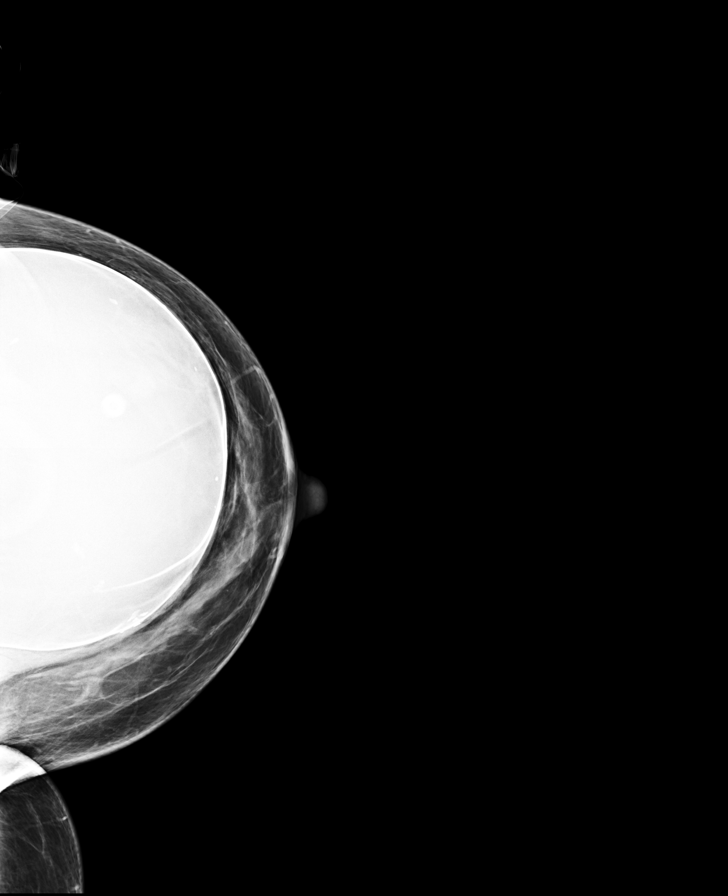

[L MLO]
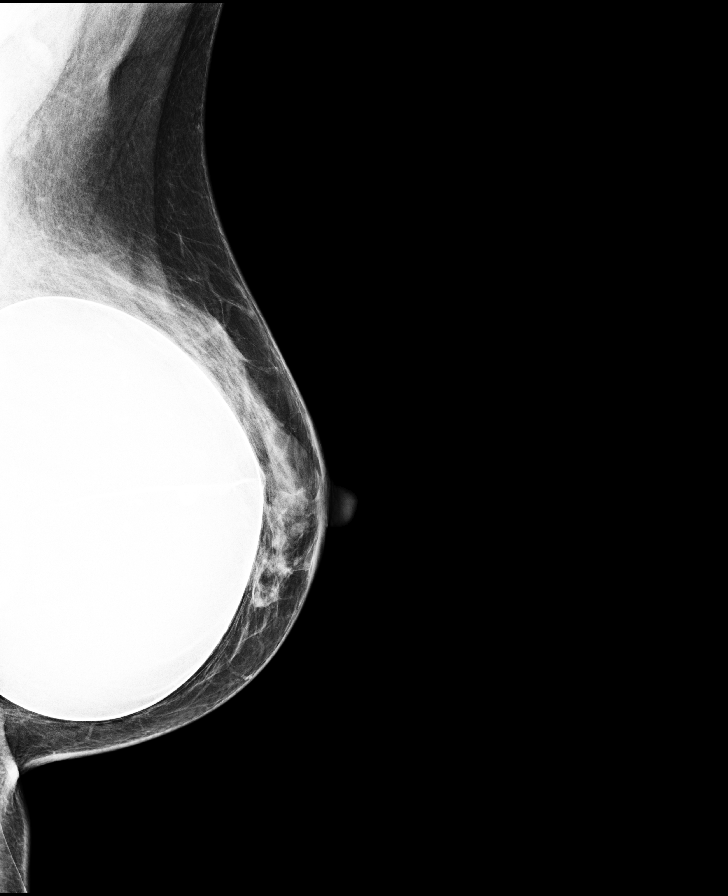

[R CC]
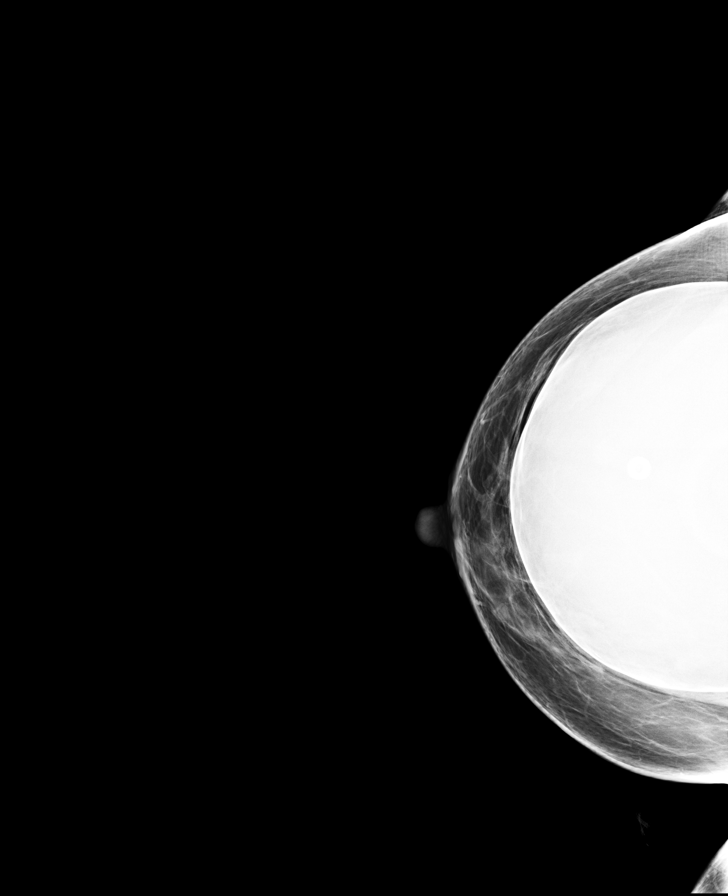

[L MLO synth-2D]
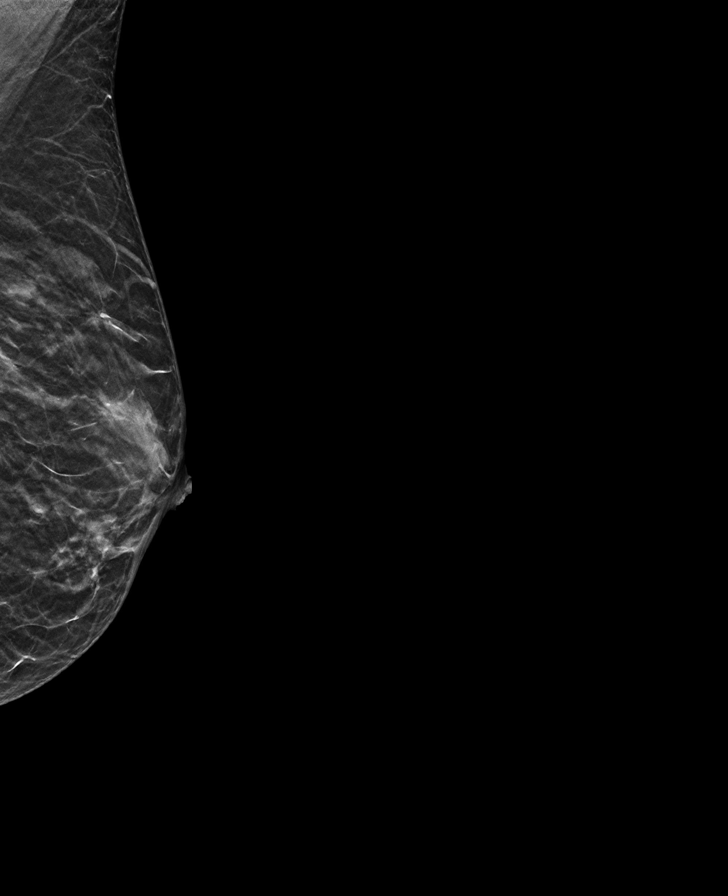

[R CC synth-2D]
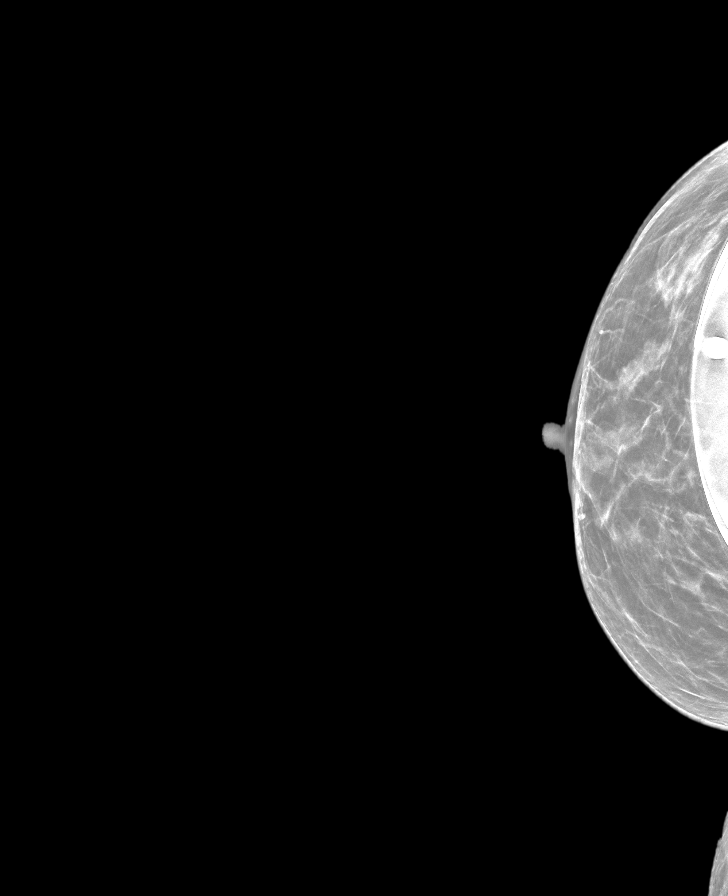

[R MLO synth-2D]
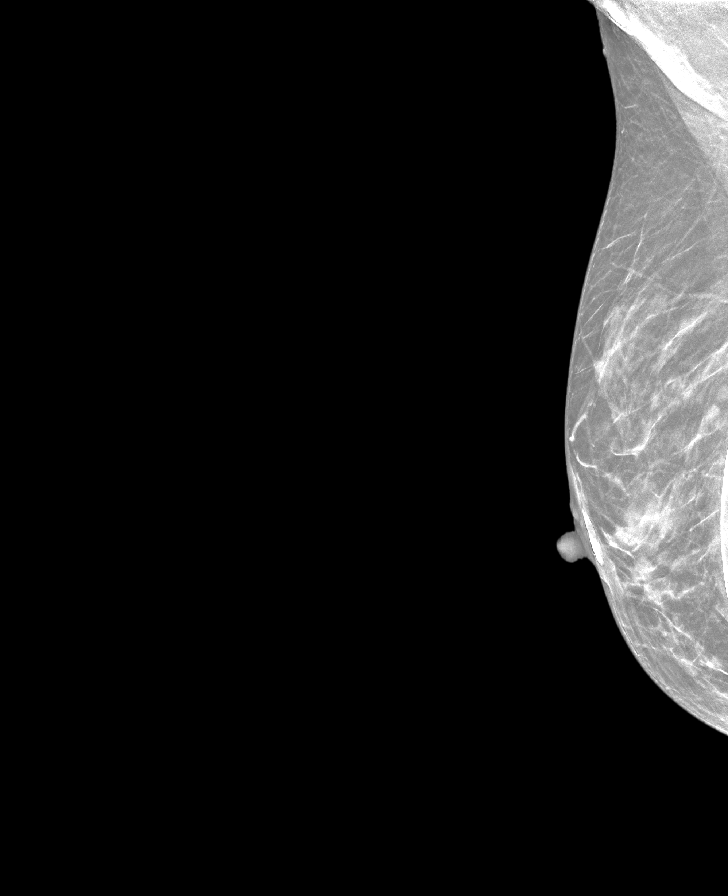

[8 of 29 positions shown; findings below may reference images not displayed]

ACR Breast Density Category c: The breast tissue is heterogeneously
dense, which may obscure small masses.
FINDINGS: There are no findings suspicious for malignancy. Bilateral saline
implants are intact. Images were processed with CAD.
IMPRESSION: No mammographic evidence of malignancy. A result letter of this
screening mammogram will be mailed directly to the patient.

RECOMMENDATION:
Screening mammogram in one year. (Code:6H-L-X1N)

BI-RADS CATEGORY  1:  Negative.

## 2022-04-20 ENCOUNTER — Other Ambulatory Visit: Payer: Self-pay | Admitting: Physical Medicine & Rehabilitation

## 2022-04-20 DIAGNOSIS — M5442 Lumbago with sciatica, left side: Secondary | ICD-10-CM

## 2022-04-20 DIAGNOSIS — M48062 Spinal stenosis, lumbar region with neurogenic claudication: Secondary | ICD-10-CM

## 2022-04-26 ENCOUNTER — Ambulatory Visit: Payer: BC Managed Care – PPO

## 2022-06-30 ENCOUNTER — Other Ambulatory Visit: Payer: Self-pay | Admitting: Family Medicine

## 2022-06-30 DIAGNOSIS — Z1231 Encounter for screening mammogram for malignant neoplasm of breast: Secondary | ICD-10-CM

## 2022-08-15 ENCOUNTER — Ambulatory Visit
Admission: RE | Admit: 2022-08-15 | Discharge: 2022-08-15 | Disposition: A | Discharge status: Home / Self Care | Payer: BC Managed Care – PPO | Source: Ambulatory Visit | Attending: Family Medicine | Admitting: Family Medicine

## 2022-08-15 DIAGNOSIS — Z1231 Encounter for screening mammogram for malignant neoplasm of breast: Secondary | ICD-10-CM | POA: Diagnosis present

## 2023-05-03 ENCOUNTER — Ambulatory Visit: Payer: Self-pay | Admitting: Urology

## 2023-06-01 ENCOUNTER — Ambulatory Visit: Payer: BC Managed Care – PPO | Admitting: Urology

## 2023-06-01 VITALS — BP 153/103 | HR 60 | Ht 60.0 in | Wt 123.4 lb

## 2023-06-01 DIAGNOSIS — Z87440 Personal history of urinary (tract) infections: Secondary | ICD-10-CM | POA: Diagnosis not present

## 2023-06-01 DIAGNOSIS — Z09 Encounter for follow-up examination after completed treatment for conditions other than malignant neoplasm: Secondary | ICD-10-CM | POA: Diagnosis not present

## 2023-06-01 DIAGNOSIS — N390 Urinary tract infection, site not specified: Secondary | ICD-10-CM

## 2023-06-01 LAB — URINALYSIS, COMPLETE
Bilirubin, UA: NEGATIVE
Glucose, UA: NEGATIVE
Ketones, UA: NEGATIVE
Nitrite, UA: NEGATIVE
Protein,UA: NEGATIVE
Specific Gravity, UA: >1.03 — ABNORMAL HIGH (ref 1.005–1.030)
Urobilinogen, Ur: 0.2 mg/dL (ref 0.2–1.0)
pH, UA: 5.5 (ref 5.0–7.5)

## 2023-06-01 LAB — MICROSCOPIC EXAMINATION: Epithelial Cells (non renal): >10 /HPF — AB (ref 0–10)

## 2023-06-01 MED ORDER — ESTRADIOL 0.1 MG/GM VA CREA: TOPICAL_CREAM | VAGINAL | 1 refills | Status: AC

## 2023-06-01 NOTE — Progress Notes (Signed)
I,Christine Cardenas,acting as a scribe for Christine Altes, MD.,have documented all relevant documentation on the behalf of Christine Altes, MD,as directed by  Christine Altes, MD while in the presence of Christine Altes, MD.  06/01/2023 2:10 PM   Christine Cardenas 08-23-1961 [id number]  Referring provider: Marisue Ivan, MD [phone] Christine Cardenas [address] [facility] [place],  [place] [address]  Chief Complaint  Patient presents with   Establish Care   Urinary Tract Infection    HPI: Christine Cardenas is a 62 y.o. female referred for evaluation of recurrent UTI.  Positive urine cultures for E.coli in March 2024, May 2024, and July 2024. She was symptomatic with frequency, urgency, and dysuria. Symptoms resolved with antibiotic therapy, however, returned. She has not had any recurrent symptoms since treated in July 2024. No bothersome LUTS. Denies gross hematuria.   PMH: Past Medical History:  Diagnosis Date   Chronic rhinitis    Headache    migaines   History of mammogram 07/2015   WNL   History of Papanicolaou smear of cervix >34YRS; 12/23/15   NO HX OF ABNL; -/-   Menopause    UTI (urinary tract infection)     Surgical History: Past Surgical History:  Procedure Laterality Date   ABDOMINAL HYSTERECTOMY  2011   Christine Cardenas, ENDOMATRIOSIS   AUGMENTATION MAMMAPLASTY Bilateral 2006   AUGMENTATION MAMMAPLASTY Bilateral 10/2019   breast lift with new breast implants bigger implants than before   BREAST SURGERY     implants   COLONOSCOPY WITH PROPOFOL N/A 07/06/2015   Procedure: COLONOSCOPY WITH PROPOFOL;  Surgeon: Christine Maxwell, MD;  Location: [facility];  Service: Endoscopy;  Laterality: N/A;   COLONOSCOPY WITH PROPOFOL N/A 12/23/2020   Procedure: COLONOSCOPY WITH PROPOFOL;  Surgeon: Christine Spillers, MD;  Location: [facility];  Service: Endoscopy;  Laterality: N/A;   HERNIA REPAIR     x2   left foot bone removed     TONSILLECTOMY      Home Medications:   Allergies as of 06/01/2023       Reactions   Effexor [venlafaxine] Other (See Comments)   Vomiting, nausea; could not sleep; felt things on scalp; weakness   Paroxetine Hcl Other (See Comments)   Felt "terrible", weakness, felt depressed   Penicillins Other (See Comments)        Medication List        Accurate as of June 01, 2023  2:10 PM. If you have any questions, ask your nurse or doctor.          estradiol 0.1 MG/GM vaginal cream Commonly known as: ESTRACE Apply a pea-sized amount to fingertip or Q-tip and wipe vaginal roof twice weekly   MULTIVITAMIN ADULT (MINERALS) PO Take 1 tablet by mouth daily. Sero vital        Allergies:  Allergies  Allergen Reactions   Effexor [Venlafaxine] Other (See Comments)    Vomiting, nausea; could not sleep; felt things on scalp; weakness   Paroxetine Hcl Other (See Comments)    Felt "terrible", weakness, felt depressed   Penicillins Other (See Comments)    Family History: Family History  Problem Relation Age of Onset   Breast cancer Neg Hx    Hypertension Neg Hx    Hyperlipidemia Neg Hx    Diabetes Neg Hx    Cancer Neg Hx     Social History:  reports that she has never smoked. She has never used smokeless tobacco. She reports that  she does not drink alcohol and does not use drugs.   Physical Exam: BP (!) 153/103   Pulse 60   Ht 5' (1.524 m)   Wt 123 lb 6.4 oz (56 kg)   BMI 24.10 kg/m   Constitutional:  Alert and oriented, No acute distress. HEENT: [place] AT Respiratory: Normal respiratory effort, no increased work of breathing. Psychiatric: Normal mood and affect.    Laboratory Data: Urinalysis  11-30 WBC, however >10 epis indicating vaginal contamination    Assessment & Plan:    1. Recurrent UTI Does meet AUA definition of recurrent UTI. She is postmenopausal and we discussed contributing factors including loss of vaginal estrogen.  Discussed starting low dose vaginal estrogen. She does have  vaginal dryness, which low dose estrogen would improve; Rx Estrace sent to pharmacy.  We discussed supplements which may have a role in UTI prevention, including cranberry and D-mannose. Urine today with pyuria, however, significant vaginal epithelial cells and she is asymptomatic. She will return as needed for recurrent UTI symptoms. Recommend annual follow up.    [facility] [address], [address] [place], [place] [address] [phone]

## 2023-06-01 NOTE — Patient Instructions (Signed)
For UTI prevention take cranberry tablets, probiotic, D-Mannose daily.  

## 2023-06-02 ENCOUNTER — Encounter: Payer: Self-pay | Admitting: Urology

## 2023-07-19 ENCOUNTER — Other Ambulatory Visit: Payer: Self-pay | Admitting: Family Medicine

## 2023-07-19 DIAGNOSIS — Z1231 Encounter for screening mammogram for malignant neoplasm of breast: Secondary | ICD-10-CM

## 2023-08-17 ENCOUNTER — Ambulatory Visit
Admission: RE | Admit: 2023-08-17 | Discharge: 2023-08-17 | Disposition: A | Discharge status: Home / Self Care | Payer: BC Managed Care – PPO | Source: Ambulatory Visit | Attending: Family Medicine | Admitting: Family Medicine

## 2023-08-17 DIAGNOSIS — Z1231 Encounter for screening mammogram for malignant neoplasm of breast: Secondary | ICD-10-CM | POA: Diagnosis present

## 2024-05-23 ENCOUNTER — Other Ambulatory Visit: Payer: Self-pay | Admitting: Family Medicine

## 2024-05-23 DIAGNOSIS — Z0000 Encounter for general adult medical examination without abnormal findings: Secondary | ICD-10-CM

## 2024-05-23 DIAGNOSIS — E7800 Pure hypercholesterolemia, unspecified: Secondary | ICD-10-CM

## 2024-06-04 ENCOUNTER — Ambulatory Visit
Admission: RE | Admit: 2024-06-04 | Discharge: 2024-06-04 | Disposition: A | Discharge status: Home / Self Care | Payer: Self-pay | Source: Ambulatory Visit | Attending: Family Medicine | Admitting: Family Medicine

## 2024-06-04 DIAGNOSIS — Z0000 Encounter for general adult medical examination without abnormal findings: Secondary | ICD-10-CM | POA: Insufficient documentation

## 2024-06-04 DIAGNOSIS — E7800 Pure hypercholesterolemia, unspecified: Secondary | ICD-10-CM | POA: Insufficient documentation

## 2024-07-08 ENCOUNTER — Other Ambulatory Visit: Payer: Self-pay | Admitting: Family Medicine

## 2024-07-08 DIAGNOSIS — Z1231 Encounter for screening mammogram for malignant neoplasm of breast: Secondary | ICD-10-CM

## 2024-08-19 ENCOUNTER — Ambulatory Visit
Admission: RE | Admit: 2024-08-19 | Discharge: 2024-08-19 | Disposition: A | Discharge status: Home / Self Care | Payer: Self-pay | Source: Ambulatory Visit | Attending: Family Medicine | Admitting: Family Medicine

## 2024-08-19 DIAGNOSIS — Z1231 Encounter for screening mammogram for malignant neoplasm of breast: Secondary | ICD-10-CM | POA: Diagnosis present

## 2024-09-10 ENCOUNTER — Other Ambulatory Visit: Payer: Self-pay | Admitting: Orthopedic Surgery

## 2024-09-10 ENCOUNTER — Ambulatory Visit
Admission: RE | Admit: 2024-09-10 | Discharge: 2024-09-10 | Disposition: A | Source: Ambulatory Visit | Attending: Orthopedic Surgery | Admitting: Orthopedic Surgery

## 2024-09-10 DIAGNOSIS — M5489 Other dorsalgia: Secondary | ICD-10-CM | POA: Insufficient documentation

## 2024-09-10 DIAGNOSIS — S12330A Unspecified traumatic displaced spondylolisthesis of fourth cervical vertebra, initial encounter for closed fracture: Secondary | ICD-10-CM | POA: Insufficient documentation
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
## 2024-09-12 ENCOUNTER — Encounter: Payer: Self-pay | Admitting: Orthopedic Surgery

## 2024-09-12 DIAGNOSIS — M5489 Other dorsalgia: Secondary | ICD-10-CM

## 2024-09-12 DIAGNOSIS — S12330A Unspecified traumatic displaced spondylolisthesis of fourth cervical vertebra, initial encounter for closed fracture: Secondary | ICD-10-CM
